# Patient Record
Sex: Female | Born: 1980 | Race: White | Hispanic: Yes | Smoking: Never smoker
Health system: Southern US, Community
[De-identification: ages and names within clinical notes are randomized; demographics above are authoritative.]

## PROBLEM LIST (undated history)

## (undated) DIAGNOSIS — E0590 Thyrotoxicosis, unspecified without thyrotoxic crisis or storm: Secondary | ICD-10-CM

## (undated) DIAGNOSIS — T7840XA Allergy, unspecified, initial encounter: Secondary | ICD-10-CM

## (undated) DIAGNOSIS — E079 Disorder of thyroid, unspecified: Secondary | ICD-10-CM

## (undated) HISTORY — DX: Allergy, unspecified, initial encounter: T78.40XA

## (undated) HISTORY — DX: Disorder of thyroid, unspecified: E07.9

---

## 2004-09-05 ENCOUNTER — Ambulatory Visit (HOSPITAL_COMMUNITY): Admission: RE | Admit: 2004-09-05 | Discharge: 2004-09-05 | Payer: Self-pay | Admitting: *Deleted

## 2004-09-26 ENCOUNTER — Ambulatory Visit (HOSPITAL_COMMUNITY): Admission: RE | Admit: 2004-09-26 | Discharge: 2004-09-26 | Payer: Self-pay | Admitting: Obstetrics & Gynecology

## 2004-10-25 ENCOUNTER — Observation Stay (HOSPITAL_COMMUNITY): Admission: AD | Admit: 2004-10-25 | Discharge: 2004-10-25 | Payer: Self-pay | Admitting: Obstetrics & Gynecology

## 2004-10-27 ENCOUNTER — Inpatient Hospital Stay (HOSPITAL_COMMUNITY): Admission: AD | Admit: 2004-10-27 | Discharge: 2004-10-30 | Payer: Self-pay | Admitting: Obstetrics & Gynecology

## 2004-10-28 ENCOUNTER — Encounter (INDEPENDENT_AMBULATORY_CARE_PROVIDER_SITE_OTHER): Payer: Self-pay | Admitting: *Deleted

## 2004-10-28 ENCOUNTER — Ambulatory Visit: Payer: Self-pay | Admitting: Obstetrics & Gynecology

## 2004-11-02 ENCOUNTER — Inpatient Hospital Stay (HOSPITAL_COMMUNITY): Admission: AD | Admit: 2004-11-02 | Discharge: 2004-11-02 | Payer: Self-pay | Admitting: *Deleted

## 2010-03-22 ENCOUNTER — Encounter (INDEPENDENT_AMBULATORY_CARE_PROVIDER_SITE_OTHER): Payer: Self-pay | Admitting: *Deleted

## 2010-03-22 LAB — CONVERTED CEMR LAB
ALT: 78 units/L — ABNORMAL HIGH (ref 0–35)
AST: 41 units/L — ABNORMAL HIGH (ref 0–37)
Albumin: 4.2 g/dL (ref 3.5–5.2)
Alkaline Phosphatase: 247 units/L — ABNORMAL HIGH (ref 39–117)
Basophils Absolute: 0 10*3/uL (ref 0.0–0.1)
Eosinophils Absolute: 0.1 10*3/uL (ref 0.0–0.7)
Eosinophils Relative: 2 % (ref 0–5)
HCT: 39.3 % (ref 36.0–46.0)
Lymphocytes Relative: 40 % (ref 12–46)
MCV: 83.1 fL (ref 78.0–100.0)
Neutrophils Relative %: 48 % (ref 43–77)
Platelets: 281 10*3/uL (ref 150–400)
Potassium: 3.9 meq/L (ref 3.5–5.3)
RDW: 13.2 % (ref 11.5–15.5)
Sodium: 140 meq/L (ref 135–145)
T3 Uptake Ratio: 46.6 % — ABNORMAL HIGH (ref 22.5–37.0)
T4, Total: 20.7 ug/dL — ABNORMAL HIGH (ref 5.0–12.5)
TSH: 0.007 microintl units/mL — ABNORMAL LOW (ref 0.350–4.500)
Total Bilirubin: 1 mg/dL (ref 0.3–1.2)
Total Protein: 6.8 g/dL (ref 6.0–8.3)
WBC: 5.7 10*3/uL (ref 4.0–10.5)

## 2010-04-24 ENCOUNTER — Ambulatory Visit (HOSPITAL_COMMUNITY)
Admission: RE | Admit: 2010-04-24 | Discharge: 2010-04-24 | Payer: Self-pay | Source: Home / Self Care | Admitting: Family Medicine

## 2010-10-19 NOTE — Discharge Summary (Signed)
NAME:  Evelyn Price NO.:  [id number]   MEDICAL RECORD NO.:  [id number]           PATIENT TYPE:   LOCATION:                                 FACILITY:   PHYSICIAN:  Lesly Dukes, M.D.      DATE OF BIRTH:   DATE OF ADMISSION:  DATE OF DISCHARGE:                                 DISCHARGE SUMMARY   Patient is a 30 year old G1, P0 who is admitted at 40 weeks and 5 days in  early labor with a cervical dilation of 2-3 cm.  Patient had prenatal care  at [facility] with late onset at 32 weeks.  She underwent Pitocin  augmentation and made slow cervical change and started pushing on the  evening of the 27th.  She pushed one hour with epidural off and had stayed  at +1.  She was consented for primary cesarean section.  [employer] [phone] patient understood that risks included, but limited to,  bleeding, infection, damage to the bowel, bladder, uterus, ovaries, and  fallopian tubes.  Patient went to the operating room and had an uneventful  cesarean section low flap transverse.  The EBL was 900 mL.  Apgars were 8  and 9.  The baby weighed 3420 g which was 7 pounds 8 ounces.  The patient  also had chorioamnionitis and IV antibiotics including ampicillin and  gentamicin prior to cesarean section.  These antibiotics were continued  postpartum for one day and then discontinued.  The patient went home breast-  feeding and on Micronor.  She used Percocet for pain.  She is to follow up  at [facility] for postpartum visit and to come to [facility] for staple  removal.       KHL/MEDQ  D:  12/06/2004  T:  12/06/2004  Job:  [id number]

## 2010-10-19 NOTE — Op Note (Signed)
NAME:  Evelyn Price NO.:  [id number]   MEDICAL RECORD NO.:  [id number]          PATIENT TYPE:  INP   LOCATION:  9145                          FACILITY:  [facility]   PHYSICIAN:  Lesly Dukes, M.D. DATE OF BIRTH:  1980-11-28   DATE OF PROCEDURE:  10/28/2004  DATE OF DISCHARGE:                                 OPERATIVE REPORT   PREOPERATIVE DIAGNOSIS:  A 30 year old, gravida 1, para 0 at term with  failure to descend.   POSTOPERATIVE DIAGNOSIS:  A 30 year old, gravida 1, para 0 at term with  failure to descend.   OPERATION/PROCEDURE:  Primary low transverse cesarean section.   SURGEON:  Lesly Dukes, M.D.   ANESTHESIA:  General.   SPECIMENS:  Placenta.   ESTIMATED BLOOD LOSS:  900 mL.   COMPLICATIONS:  None.   FINDINGS:  Viable female infant, Apgars 8 at one minute and 9 at five minutes.  Vertex ROT, asynclitic presentation with large caput. Clear fluid.  Arterial  cord pH 7.30.  Normal placenta with a three-vessel cord.  Normal uterus,  ovaries and fallopian tubes.  Mild atony that was resolved with carboprost  and Methergine.  NICU team at delivery.   DESCRIPTION OF PROCEDURE:  After fully informed consent was obtained, the  patient was taken to the operating room where general anesthesia had to be  induced secondary to the epidural not providing adequate anesthesia.  The  general anesthesia was induced after normal sterile prep was done.  A  Pfannenstiel skin incision was made with the scalpel and carried down to the  layer of fascia.  The fascia was incised in the midline and the incision was  extended bilaterally with the Mayo scissors.  Superior and inferior aspect  of the fascial incision was grasped with Kocher clamps, tented up, dissected  off sharply and bluntly to the underlying layers of rectus muscles.  The  rectus muscles were separated bluntly in the midline.  The peritoneum was  identified and entered bluntly.  The vesicouterine  peritoneum was  identified, tented up, entered sharply with Metzenbaum scissors.  This  incision was extended bilaterally.  Bladder flap was created digitally.  The  uterine incision was made in transverse fashion with a scalpel.  This  incision was extended bilaterally bluntly.  Baby's head delivered easily.  Nose and mouth were suctioned.  The rest of the baby's body was delivered  without complications.  Cord was clamped and cut and the baby was handed to  the waiting pediatrician.  Cord blood was sent for type and screen.  Cord  gas was also sent with the above findings.  Placenta delivered spontaneously  and appeared grossly normal with a three-vessel cord.  The uterus was  exteriorized and cleared of clots and debris.  The uterus was closed with 0  Vicryl in a running locked fashion.  Hemostasis was noted.  0 Vicryl was  used to help reinforce the incision.  The uterus was returned to the abdomen  and noted to be hemostatic on tension.  The gutters were cleared of all  clots  and debris.  Hemostasis was assured __________ time on the uterus.  The peritoneum and rectus muscle were noted to be hemostatic.  Fascia was  closed with 0 Vicryl in a running fashion and good hemostasis was noted on  this layer as well.  Subcutaneous tissue  was copiously irrigated and found to be hemostatic.  Skin was closed with  staples.  The patient tolerated the procedure well.  Sponge, lap,  instrument, and needle counts were correct x2.  The patient tolerated the  procedure well.      KHL/MEDQ  D:  10/28/2004  T:  10/28/2004  Job:  [id number]

## 2011-01-25 ENCOUNTER — Other Ambulatory Visit: Payer: Self-pay | Admitting: Internal Medicine

## 2011-01-30 ENCOUNTER — Ambulatory Visit
Admission: RE | Admit: 2011-01-30 | Discharge: 2011-01-30 | Disposition: A | Discharge status: Home / Self Care | Payer: Self-pay | Source: Ambulatory Visit | Attending: Internal Medicine | Admitting: Internal Medicine

## 2012-03-15 ENCOUNTER — Ambulatory Visit: Payer: Self-pay | Admitting: Family Medicine

## 2012-03-15 VITALS — BP 112/72 | HR 68 | Temp 98.2°F | Resp 16 | Ht 66.25 in | Wt 119.6 lb

## 2012-03-15 DIAGNOSIS — N926 Irregular menstruation, unspecified: Secondary | ICD-10-CM

## 2012-03-15 DIAGNOSIS — Z309 Encounter for contraceptive management, unspecified: Secondary | ICD-10-CM

## 2012-03-15 DIAGNOSIS — N898 Other specified noninflammatory disorders of vagina: Secondary | ICD-10-CM

## 2012-03-15 DIAGNOSIS — N939 Abnormal uterine and vaginal bleeding, unspecified: Secondary | ICD-10-CM

## 2012-03-15 MED ORDER — NORGESTIMATE-ETH ESTRADIOL 0.25-35 MG-MCG PO TABS: 1.0000 | ORAL_TABLET | Freq: Every day | ORAL | Status: DC

## 2012-03-15 NOTE — Progress Notes (Signed)
[address]   [place], [place]  [address]   [phone]  Subjective:    Patient ID: Evelyn Price, female    DOB: 02-16-1981, 31 y.o.   MRN: [id number]  HPIThis 31 y.o. female presents for evaluation of vaginal bleeding irregular.  +low back pain.  LMP 02/29/12 normal menses.  Regular menses usually but now two weeks early this time.  Took morning after pill on Thursday/3 days ago.  Last sexual encounter was Wednesday.  Has been sexually active; no condoms.  Mild vaginal discharge clear stringy without odor.  Mild vaginal itching for several weeks.  Bought OTC medications; helped.  No history of STDs.  Current partner  X 1 year.  Total partners=2.  Mild low back pain.  Bleeding slightly lighter than normal menses.  Previous use of OCP.  No tobacco abuse.  No history of blood clots.  S/p follow-up with PCP one month ago; thyroid levels stable on current dose of medication.  PCP:  Marcy Panning PMH:  Hyperthyroidism Psurg: C-section x 1 All; NKDA Social: single; dating; lives with son; employment cleaning; no tobacco/ETOH/drugs.   Family :  M-living at age 46; healthy.  F-healthy; living age 93.  Siblings: healthy.   Review of Systems  Constitutional: Negative for fever, chills, diaphoresis and fatigue.  Genitourinary: Positive for vaginal bleeding, vaginal discharge, menstrual problem and pelvic pain. Negative for dysuria, urgency, frequency, hematuria, flank pain, enuresis, genital sores, vaginal pain and dyspareunia.       Objective:   Physical Exam  Nursing note and vitals reviewed. Constitutional: She is oriented to person, place, and time. She appears well-developed and well-nourished.  HENT:  Head: Normocephalic and atraumatic.  Eyes: Conjunctivae normal are normal. Pupils are equal, round, and reactive to light.  Neck: Normal range of motion. Neck supple. No thyromegaly present.  Cardiovascular: Normal rate, regular rhythm and normal heart sounds.   No murmur  heard. Pulmonary/Chest: Effort normal and breath sounds normal.  Abdominal: Soft. Bowel sounds are normal. She exhibits no distension and no mass. There is no tenderness. There is no rebound and no guarding.  Genitourinary: Uterus normal. There is no rash, tenderness, lesion or injury on the right labia. There is no rash, tenderness, lesion or injury on the left labia. There is bleeding around the vagina. No vaginal discharge found.       MODERATE AMOUNT OF BLOOD IN VAGINAL VAULT; ACTIVE MILD BLEEDING FROM CERVICAL OS.  Neurological: She is alert and oriented to person, place, and time.  Psychiatric: She has a normal mood and affect. Her behavior is normal. Judgment and thought content normal.   Results for orders placed in visit on 03/15/12  POCT URINE PREGNANCY      Component Value Range   Preg Test, Ur Negative         Assessment & Plan:   1. Abnormal menses  POCT urine pregnancy, GC/chlamydia probe amp, genital  2. Vaginal bleeding  GC/chlamydia probe amp, genital  3. Contraception management  norgestimate-ethinyl estradiol (SPRINTEC 28) 0.25-35 MG-MCG tablet     1.  Irregular vaginal bleeding: New.  Onset 72 hours after taking Morning After Pill; urine pregnancy negative. Advised pt that irregular vaginal bleeding common side effect of Morning After Pill or any OCP.  Send GC/Chlam due to recent vaginal discharge.   Recommend repeat urine pregnancy test OTC in one month. 2.  Contraception Management: New.  Desires contraception.  Non-smoker; no family history of clotting; rx for Sprintec to obtain  at [facility] for $9/month.  Follow-up with PCP.

## 2012-03-15 NOTE — Patient Instructions (Addendum)
1. Abnormal menses  POCT urine pregnancy, GC/chlamydia probe amp, genital  2. Vaginal bleeding  GC/chlamydia probe amp, genital  3. Contraception management  norgestimate-ethinyl estradiol (SPRINTEC 28) 0.25-35 MG-MCG tablet

## 2012-03-16 LAB — GC/CHLAMYDIA PROBE AMP, GENITAL: Chlamydia, DNA Probe: NEGATIVE

## 2012-03-18 ENCOUNTER — Encounter: Payer: Self-pay | Admitting: Family Medicine

## 2012-09-19 ENCOUNTER — Other Ambulatory Visit: Payer: Self-pay | Admitting: Physician Assistant

## 2012-09-19 ENCOUNTER — Telehealth: Payer: Self-pay | Admitting: *Deleted

## 2012-09-19 ENCOUNTER — Ambulatory Visit: Payer: Self-pay | Admitting: Physician Assistant

## 2012-09-19 VITALS — BP 121/71 | HR 70 | Temp 98.2°F | Resp 16 | Ht 60.0 in | Wt 125.0 lb

## 2012-09-19 DIAGNOSIS — E0590 Thyrotoxicosis, unspecified without thyrotoxic crisis or storm: Secondary | ICD-10-CM | POA: Insufficient documentation

## 2012-09-19 DIAGNOSIS — Z124 Encounter for screening for malignant neoplasm of cervix: Secondary | ICD-10-CM

## 2012-09-19 DIAGNOSIS — N898 Other specified noninflammatory disorders of vagina: Secondary | ICD-10-CM

## 2012-09-19 DIAGNOSIS — J309 Allergic rhinitis, unspecified: Secondary | ICD-10-CM | POA: Insufficient documentation

## 2012-09-19 DIAGNOSIS — R81 Glycosuria: Secondary | ICD-10-CM

## 2012-09-19 LAB — POCT URINALYSIS DIPSTICK
Bilirubin, UA: NEGATIVE
Glucose, UA: 100
Leukocytes, UA: NEGATIVE
Nitrite, UA: NEGATIVE

## 2012-09-19 LAB — POCT WET PREP WITH KOH: Clue Cells Wet Prep HPF POC: NEGATIVE

## 2012-09-19 LAB — POCT CBC
Granulocyte percent: 60.4 %G (ref 37–80)
HCT, POC: 42.2 % (ref 37.7–47.9)
POC Granulocyte: 5.4 (ref 2–6.9)
POC LYMPH PERCENT: 32.1 %L (ref 10–50)
Platelet Count, POC: 283 10*3/uL (ref 142–424)
RDW, POC: 13.7 %

## 2012-09-19 LAB — POCT UA - MICROSCOPIC ONLY
WBC, Ur, HPF, POC: NEGATIVE
Yeast, UA: NEGATIVE

## 2012-09-19 LAB — GLUCOSE, POCT (MANUAL RESULT ENTRY): POC Glucose: 94 mg/dl (ref 70–99)

## 2012-09-19 MED ORDER — OLOPATADINE HCL 0.2 % OP SOLN: 1.0000 [drp] | Freq: Two times a day (BID) | OPHTHALMIC | Status: DC | PRN

## 2012-09-19 MED ORDER — FLUTICASONE PROPIONATE 50 MCG/ACT NA SUSP: 2.0000 | Freq: Every day | NASAL | Status: DC

## 2012-09-19 NOTE — Telephone Encounter (Signed)
error 

## 2012-09-19 NOTE — Patient Instructions (Addendum)
For now, continue the oral antihistamine.  When the eye drops and nasal spray have helped your symptoms, stop the oral antihistamine. I will contact you with your lab results as soon as they are available.   If you have not heard from me in 2 weeks, please contact me.  The fastest way to get your results is to register for My Chart (see the instructions on the last page of this printout).

## 2012-09-19 NOTE — Progress Notes (Signed)
Subjective:    Patient ID: Evelyn Price, female    DOB: [birth year], 32 y.o.   MRN: [id number]  HPI This 32 y.o. female presents for evaluation of allergies and "physical."  Itchy eyes.  "I know I have allergy to the pollen.  The claritin and all from the pharmacies don't work, so I need something from the doctor." Watery eyes, some whitish drainange from her eyes.  Nasal congestion.  "I need a physical, because I think I have an infection." Some intermittent discharge, itching, dyspareunia, but inconsistent.  History of vaginal yeast infection with these symptoms. Last pap test about 2 years ago, desires a pap today. Nothing to prevent STI or pregnancy. She thinks her boyfriend has other partners, though he denies it.   Review of Systems  Constitutional: Negative.   Eyes: Positive for discharge (clear), redness and itching. Negative for visual disturbance.  Respiratory: Negative for shortness of breath.   Cardiovascular: Negative for chest pain.  Gastrointestinal: Negative for nausea, vomiting and diarrhea.  Endocrine: Negative.   Genitourinary: Positive for vaginal discharge, vaginal pain and dyspareunia. Negative for dysuria, urgency, hematuria, menstrual problem and pelvic pain.  Musculoskeletal: Negative for myalgias and arthralgias.  Allergic/Immunologic: Positive for environmental allergies. Negative for food allergies and immunocompromised state.  Neurological: Positive for headaches. Negative for dizziness.  Hematological: Negative.   Psychiatric/Behavioral: Negative for sleep disturbance.       Objective:   Physical Exam  Vitals reviewed. Constitutional: She is oriented to person, place, and time. Vital signs are normal. She appears well-developed and well-nourished. She is active and cooperative. No distress.  HENT:  Head: Normocephalic and atraumatic.  Right Ear: Hearing, tympanic membrane, external ear and ear canal normal. No foreign bodies.  Left Ear:  Hearing, tympanic membrane, external ear and ear canal normal. No foreign bodies.  Nose: Nose normal.  Mouth/Throat: Uvula is midline, oropharynx is clear and moist and mucous membranes are normal. No oral lesions. Normal dentition. No dental abscesses or edematous. No oropharyngeal exudate.  Eyes: Conjunctivae, EOM and lids are normal. Pupils are equal, round, and reactive to light. Right eye exhibits no discharge. Left eye exhibits no discharge. No scleral icterus.  Fundoscopic exam:      The right eye shows no arteriolar narrowing, no AV nicking, no exudate, no hemorrhage and no papilledema.       The left eye shows no arteriolar narrowing, no AV nicking, no exudate, no hemorrhage and no papilledema.  Neck: Trachea normal, normal range of motion and full passive range of motion without pain. Neck supple. No spinous process tenderness and no muscular tenderness present. No mass and no thyromegaly present.  Cardiovascular: Normal rate, regular rhythm, normal heart sounds, intact distal pulses and normal pulses.   Pulmonary/Chest: Effort normal and breath sounds normal. She exhibits no tenderness and no retraction. Right breast exhibits no inverted nipple, no mass, no nipple discharge, no skin change and no tenderness. Left breast exhibits no inverted nipple, no mass, no nipple discharge, no skin change and no tenderness. Breasts are symmetrical.  Abdominal: Soft. Normal appearance and bowel sounds are normal. She exhibits no distension and no mass. There is no hepatosplenomegaly. There is no tenderness. There is no rigidity, no rebound, no guarding, no CVA tenderness, no tenderness at McBurney's point and negative Murphy's sign. No hernia. Hernia confirmed negative in the right inguinal area and confirmed negative in the left inguinal area.  Genitourinary: Rectum normal, vagina normal and uterus normal. Rectal exam shows no external  hemorrhoid and no fissure. No breast swelling, tenderness, discharge or  bleeding. Pelvic exam was performed with patient supine. No labial fusion. There is no rash, tenderness, lesion or injury on the right labia. There is no rash, tenderness, lesion or injury on the left labia. Cervix exhibits no motion tenderness, no discharge and no friability. Right adnexum displays no mass, no tenderness and no fullness. Left adnexum displays no mass, no tenderness and no fullness. No erythema, tenderness or bleeding around the vagina. No foreign body around the vagina. No signs of injury around the vagina. No vaginal discharge found.  Musculoskeletal: She exhibits no edema and no tenderness.       Cervical back: Normal.       Thoracic back: Normal.       Lumbar back: Normal.  Lymphadenopathy:       Head (right side): No tonsillar, no preauricular, no posterior auricular and no occipital adenopathy present.       Head (left side): No tonsillar, no preauricular, no posterior auricular and no occipital adenopathy present.    She has no cervical adenopathy.    She has no axillary adenopathy.       Right: No inguinal and no supraclavicular adenopathy present.       Left: No inguinal and no supraclavicular adenopathy present.  Neurological: She is alert and oriented to person, place, and time. She has normal strength and normal reflexes. No cranial nerve deficit. She exhibits normal muscle tone. Coordination and gait normal.  Skin: Skin is warm, dry and intact. No rash noted. She is not diaphoretic. No cyanosis or erythema. Nails show no clubbing.  Psychiatric: She has a normal mood and affect. Her speech is normal and behavior is normal. Judgment and thought content normal.    Results for orders placed in visit on 09/19/12  POCT CBC      Result Value Range   WBC 8.9  4.6 - 10.2 K/uL   Lymph, poc 2.9  0.6 - 3.4   POC LYMPH PERCENT 32.1  10 - 50 %L   MID (cbc) 0.7  0 - 0.9   POC MID % 7.5  0 - 12 %M   POC Granulocyte 5.4  2 - 6.9   Granulocyte percent 60.4  37 - 80 %G   RBC  4.50  4.04 - 5.48 M/uL   Hemoglobin 13.2  12.2 - 16.2 g/dL   HCT, POC 40.9  81.1 - 47.9 %   MCV 93.7  80 - 97 fL   MCH, POC 29.3  27 - 31.2 pg   MCHC 31.3 (*) 31.8 - 35.4 g/dL   RDW, POC 91.4     Platelet Count, POC 283  142 - 424 K/uL   MPV 8.6  0 - 99.8 fL  GLUCOSE, POCT (MANUAL RESULT ENTRY)      Result Value Range   POC Glucose 94  70 - 99 mg/dl  POCT UA - MICROSCOPIC ONLY      Result Value Range   WBC, Ur, HPF, POC neg     RBC, urine, microscopic 0-1     Bacteria, U Microscopic neg     Mucus, UA neg     Epithelial cells, urine per micros neg     Crystals, Ur, HPF, POC neg     Casts, Ur, LPF, POC neg     Yeast, UA neg    POCT URINALYSIS DIPSTICK      Result Value Range   Color, UA light yellow  Clarity, UA clear     Glucose, UA 100     Bilirubin, UA neg     Ketones, UA neg     Spec Grav, UA 1.015     Blood, UA small     pH, UA 7.0     Protein, UA neg     Urobilinogen, UA 0.2     Nitrite, UA neg     Leukocytes, UA Negative    POCT WET PREP WITH KOH      Result Value Range   Trichomonas, UA Negative     Clue Cells Wet Prep HPF POC neg     Epithelial Wet Prep HPF POC 7-16     Yeast Wet Prep HPF POC neg     Bacteria Wet Prep HPF POC 2+     RBC Wet Prep HPF POC neg     WBC Wet Prep HPF POC 0-2     KOH Prep POC Negative    POCT GLYCOSYLATED HEMOGLOBIN (HGB A1C)      Result Value Range   Hemoglobin A1C 5.6         Assessment & Plan:  Allergic rhinitis - Plan: fluticasone (FLONASE) 50 MCG/ACT nasal spray, Olopatadine HCl 0.2 % SOLN; continue OTC oral antihistamine for now.  Screening for cervical cancer - Plan: Pap IG, CT/NG w/ reflex HPV when ASC-U  Vaginal discharge - Plan: POCT CBC, POCT glucose (manual entry), POCT UA - Microscopic Only, POCT urinalysis dipstick, POCT Wet Prep with KOH; encourage condom use, adequate hydration.  Glucosuria - Plan: POCT glycosylated hemoglobin (Hb A1C). No other evidence of diabetes.  Healthy eating, exercise, hydration.   Repeat at next visit.  Fernande Bras, PA-C Physician Assistant-Certified [facility]

## 2012-09-21 ENCOUNTER — Encounter: Payer: Self-pay | Admitting: Physician Assistant

## 2012-09-21 LAB — PAP IG, CT-NG, RFX HPV ASCU: Chlamydia Probe Amp: NEGATIVE

## 2012-09-21 NOTE — Progress Notes (Signed)
This encounter was created in error - please disregard.

## 2012-09-23 ENCOUNTER — Encounter: Payer: Self-pay | Admitting: Physician Assistant

## 2013-06-17 ENCOUNTER — Ambulatory Visit: Payer: Self-pay | Admitting: Emergency Medicine

## 2013-06-17 VITALS — BP 110/64 | HR 100 | Temp 101.8°F | Resp 18 | Ht 60.5 in | Wt 120.0 lb

## 2013-06-17 DIAGNOSIS — J36 Peritonsillar abscess: Secondary | ICD-10-CM

## 2013-06-17 DIAGNOSIS — J020 Streptococcal pharyngitis: Secondary | ICD-10-CM

## 2013-06-17 DIAGNOSIS — J029 Acute pharyngitis, unspecified: Secondary | ICD-10-CM

## 2013-06-17 LAB — POCT RAPID STREP A (OFFICE): RAPID STREP A SCREEN: POSITIVE — AB

## 2013-06-17 NOTE — Progress Notes (Addendum)
   Subjective:  This chart was scribed for Viviann SpareSteven A. Cleta Albertsaub, MD by Ashley JacobsBrittany Andrews, [facility] Scribe. The patient was seen in room and the patient's care was started at 8:58 AM.   Patient ID: Evelyn Price, female    DOB: Jul 16, 1980, 33 y.o.   MRN: [id number]  HPI HPI Comments: Evelyn Price is a 33 y.o. female who arrives to the [facility] complaining of sore throat, fever, and emesis for the past six days. She states having peritonsillar abscess infection last year. She is experiencing constant right sided pain and edema to her tonsil. Pt has the associated symptoms of voice changes, drooling, chills and cough. Nothing seems to resolve her symptoms. She has a past medical hx of allergic rhinitis and hyperthyroidism. She does not have any known allergies to medications.     Review of Systems  Constitutional: Positive for fever and chills.  HENT: Positive for drooling and voice change.   Respiratory: Positive for cough.   Gastrointestinal: Positive for nausea and vomiting.  All other systems reviewed and are negative.   BP 110/64  Pulse 100  Temp(Src) 101.8 F (38.8 C) (Oral)  Resp 18  Ht 5' 0.5" (1.537 m)  Wt 120 lb (54.432 kg)  BMI 23.04 kg/m2  SpO2 98%  LMP 05/30/2013     Objective:   Physical Exam  Nursing note and vitals reviewed. Constitutional: She is oriented to person, place, and time. She appears well-developed and well-nourished. No distress.  HENT:  Head: Normocephalic and atraumatic.  Right Ear: Hearing, tympanic membrane, external ear and ear canal normal.  Left Ear: Hearing, tympanic membrane, external ear and ear canal normal.  Mouth/Throat: Uvula swelling present. Tonsillar abscesses present.  Swollen uvula displacement of uvula to the left  Eyes: EOM are normal. Pupils are equal, round, and reactive to light.  Neck: Normal range of motion. Neck supple. No tracheal deviation present.    Cardiovascular: Normal rate.   Pulmonary/Chest: Effort normal. No respiratory distress.  Abdominal: Soft. She exhibits no distension.  Musculoskeletal: Normal range of motion.  Neurological: She is alert and oriented to person, place, and time.  Skin: Skin is warm and dry.  Psychiatric: She has a normal mood and affect. Her behavior is normal.   Results for orders placed in visit on 06/17/13  POCT RAPID STREP A (OFFICE)      Result Value Range   Rapid Strep A Screen Positive (*) Negative      DIAGNOSTIC STUDIES: Oxygen Saturation is 98% on room air, normal by my interpretation.    COORDINATION OF CARE:  9:04 AM Discussed course of care with pt which includes referral to ENT. Pt understands and agrees.       Assessment & Plan:   The patient has a right peritonsillar abscess she is going to ENT for their evaluation she does understand . Also tested positive for strep.

## 2013-06-17 NOTE — Patient Instructions (Addendum)
Celulitis periamigdalina  (Peritonsillar Cellulitis) La celulitis periamigdalina es una infeccin alrededor de Designer, jewelleryla amgdala. Esta infeccin suele afectar a Norfolk Southernuna de las dos amgdalas. El resultado es un fuerte dolor de Advertising copywritergarganta. La celulitis periamigdalina puede aparecer a cualquier edad. Generalmente se desarrolla en personas que han sufrido dolores de garganta frecuentes y que han tomado antibiticos con frecuencia.  CAUSAS  La celulitis periamigdalina suele tener su origen en ms de un tipo de germen (bacterias).  SNTOMAS  En un comienzo, puede parecer como un dolor de garganta habitual. Pero en este caso el dolor no desaparece luego de Hartford Financialpocos das. En cambio, empeora.   Los primeros sntomas de la celulitis periamigdalina pueden ser:  Grant RutsFiebre o escalofros.  Dolor de Metallurgistgarganta en un solo lado.  Dolor en un odo.  Dolor al tragar.  Sentirse ms cansado que de Dellrosecostumbre.  Entre los sntomas posteriores se incluyen:  Dolor intenso al tragar.  Babeo.  Dificultad para abrir Government social research officerla boca.  Mal aliento.  Cambios en la voz. DIAGNSTICO  En la mayora de los North Crows Nestcasos, Oregonel mdico har el diagnstico al The Northwestern Mutualconocer los sntomas, haciendo un examen de la garganta y tomando un cultivo de las secreciones de Administratorla garganta. Los ARAMARK Corporationanlisis de sangre tambin pueden ayudar a Production assistant, radiodeterminar la causa de su dolor de Advertising copywritergarganta.  TRATAMIENTO  No se trata de un dolor de garganta comn. Es una enfermedad que debe tratarse rpidamente. Si no se trata, puede haber hinchazn y pus (absceso).   Esta afeccin generalmente se trata con antibiticos. Estas infecciones requieren antibiticos por va oral durante un total de 10 das o antibiticos administrados por vena (intravenoso, IV).  Podrn recetarle medicamentos para calmar el dolor o la fiebre.  En algunos casos se recetan medicamentos para disminuir la inflamacin (corticoides).  Si se ha formado un absceso, puede ser necesario drenarlo.  Las personas que han repetido  los casos de celulitis periamigdalina pueden necesitar una operacin para extirpar las amgdalas (amigdalectoma). INSTRUCCIONES PARA EL CUIDADO EN EL HOGAR   Tome todos los medicamentos segn le indic su mdico. Finalice todos los antibiticos, aunque comience a sentirse mejor.  Es normal sentir un poco de Copywriter, advertisingdolor en esta afeccin. Tome los analgsicos segn las indicaciones del mdico. No tome otros medicamentos para el dolor excepto que lo autorice el mdico.  Hgase grgaras con agua tibia con sal. Mezcle 1 cucharadita (5 gramos) de sal en 1 taza (250 ml) de agua tibia. Debe hacerse las grgaras durante 30 segundos o ms antes de OfficeMax Incorporatedescupir la solucin. Hgalas de 3 a 4 veces al da o segn sea necesario. Esto le Engineer, materialsaliviar el dolor y la hinchazn.  Ser necesario que siga una dieta lquida o blanda si no puede tragar.  Es importante beber lquidos. Beba gran cantidad de lquido para mantener la orina de tono claro o color amarillo plido.  No fume.  Haga reposo y Wintonduerma bien.  Si el mdico le ha dado fecha para una visita de control, es importante que concurra. El mdico necesitar asegurarse de que la infeccin [place] Springsmejora. Es importante controlar que no se forme un absceso.  Regrese a su trabajo o a la escuela cuando el profesional se lo indique. SOLICITE ATENCIN MDICA SI:   La hinchazn aumenta.  Tiene dificultad para tragar.  No puede tomar los antibiticos. SOLICITE ATENCIN MDICA DE INMEDIATO SI:   Tiene dificultad para respirar.  El dolor Siletzempeora, a pesar de haber tomado analgsicos.  Tiene pus alrededor o cerca de las Goldfieldamgdalas.  Willow OraLe cambia  la voz.  Babea.  Tose y escupe esputo sanguinolento.  No puede tragar.  Tiene fiebre. ASEGRESE DE QUE:   Comprende estas instrucciones.  Controlar su enfermedad.  Solicitar ayuda de inmediato si no mejora o si empeora. Document Released: 05/06/2012 [facility] Patient Information 2014 [place], [place].  Angina por  estreptococos (Strep Throat)  La angina estreptocccica es una infeccin en la garganta causada por una bacteria llamada Streptococcus pyogenes. El mdico la llamar "amigdalitis" o "faringitis" estreptocccica, segn haya signos de inflamacin en las amgdalas o en la zona posterior de la garganta. Es ms frecuente en nios entre los 5 y los 15 aos durante los meses de fro, Biomedical engineer puede ocurrir a Dealer de Actuary. La infeccin se transmite de persona a persona (contagio) a travs de la tos, el estornudo u otro contacto cercano.  SNTOMAS  Fiebre o escalofros.  La garganta o las [employer] duelen y estn inflamadas.  Dolor o dificultad para tragar.  Puntos blancos o amarillos en las amgdalas o la garganta.  Ganglios linfticos hinchados a los lados del cuello o debajo de la [place].  Erupcin rojiza en todo el cuerpo (poco comn). DIAGNSTICO Diferentes infecciones pueden causar los mismos sntomas. Para confirmar el diagnstico deber [employer], y as podrn prescribirle el tratamiento adecuado. La "prueba rpida de estreptococo" ayudar al mdico a hacer el diagnstico en algunos minutos. Si no se dispone de la prueba, se har un rpido hisopado de la zona afectada para hacer un cultivo de las secreciones de la garganta. Si se hace un cultivo, los resultados estarn disponibles en [employer].  TRATAMIENTO El estreptococo de garganta se trata con antibiticos. INSTRUCCIONES PARA EL CUIDADO DOMICILIARIO  Haga grgaras con 1 cucharadita de sal en 1 taza de agua tibia, 3  4 veces por da o cuando lo crea necesario para sentirse mejor.  Los miembros de la familia que tambin tengan dolor en la garganta deben ser evaluados y tratados con antibiticos si tienen la infeccin.  Asegrese de que todas las personas de su casa se laven [employer].  No comparta alimentos, tazas ni utensilios personales que puedan contagiar la infeccin.  Coma alimentos blandos hasta  que el dolor de garganta mejore.  Beba gran cantidad de lquido para mantener la orina de tono claro o color amarillo plido. Esto ayudar a Agricultural engineer.  Debe hacer reposo.  No concurra a la escuela o la trabajo hasta que haya tomado los antibiticos durante 24 horas.  Utilice los medicamentos de venta libre o de prescripcin para Chief Technology Officer, Environmental health practitioner o la [place], segn se lo indique el profesional que lo asiste.  Si le han recetado medicamentos, tmelos segn las indicaciones. Finalice la prescripcin completa, aunque se sienta mejor. SOLICITE ATENCIN MDICA SI:  Los ganglios del cuello siguen agrandados.  Aparece una erupcin cutnea, tos o dolor de odos.  Tiene un catarro verde, amarillo amarronado o esputo sanguinolento.  Siente dolor o Dentist que no puede controlar con los medicamentos.  Los sntomas parecen empeorar en vez de mejorar. SOLICITE ATENCIN MDICA DE INMEDIATO SI:  Presenta algn nuevo sntoma, como vmitos, dolor de cabeza intenso, rigidez o dolor en el cuello, dolor en el pecho o problemas respiratorios o dificultad para tragar.  Presenta dolor de garganta intenso, babeo o cambios en la voz.  Siente que el cuello se hincha o la piel de esa zona se vuelve roja y sensible.  Tiene fiebre.  Tiene signos de deshidratacin, como fatiga, boca  seca y disminucin de la [place].  Comienza a sentir mucho sueo, o no puede despertarse bien. Document Released: 02/27/2005 Document Revised: 05/06/2012 [facility] Patient Information 2014 [place], [place].

## 2013-07-06 ENCOUNTER — Encounter: Payer: Self-pay | Admitting: Family Medicine

## 2013-07-06 DIAGNOSIS — J36 Peritonsillar abscess: Secondary | ICD-10-CM | POA: Insufficient documentation

## 2015-10-17 ENCOUNTER — Ambulatory Visit (INDEPENDENT_AMBULATORY_CARE_PROVIDER_SITE_OTHER): Payer: Self-pay | Admitting: Gynecology

## 2015-10-17 ENCOUNTER — Encounter: Payer: Self-pay | Admitting: Gynecology

## 2015-10-17 VITALS — BP 122/80 | Ht 60.0 in | Wt 133.0 lb

## 2015-10-17 DIAGNOSIS — N888 Other specified noninflammatory disorders of cervix uteri: Secondary | ICD-10-CM

## 2015-10-17 DIAGNOSIS — N941 Unspecified dyspareunia: Secondary | ICD-10-CM

## 2015-10-17 NOTE — Progress Notes (Signed)
   HPI: Patient is a 35 year old gravida 1 para 1 who was referred to our practice from her primary care physician as a result of an incidental finding on patient's cervix at time of her annual exam. We'll able to retrieve their office notes and she had a Pap smear within the past 6 months which was normal. Patient was complaining of some dyspareunia time. She reports normal menstrual cycles and her husband is using condoms for contraception. Her PCP is treating her for hypothyroidism.   ROS: A ROS was performed and pertinent positives and negatives are included in the history.  GENERAL: No fevers or chills. HEENT: No change in vision, no earache, sore throat or sinus congestion. NECK: No pain or stiffness. CARDIOVASCULAR: No chest pain or pressure. No palpitations. PULMONARY: No shortness of breath, cough or wheeze. GASTROINTESTINAL: No abdominal pain, nausea, vomiting or diarrhea, melena or bright red blood per rectum. GENITOURINARY: No urinary frequency, urgency, hesitancy or dysuria. MUSCULOSKELETAL: No joint or muscle pain, no back pain, no recent trauma. DERMATOLOGIC: No rash, no itching, no lesions. ENDOCRINE: No polyuria, polydipsia, no heat or cold intolerance. No recent change in weight. HEMATOLOGICAL: No anemia or easy bruising or bleeding. NEUROLOGIC: No headache, seizures, numbness, tingling or weakness. PSYCHIATRIC: No depression, no loss of interest in normal activity or change in sleep pattern.   PE: Gen. appearance well-developed 1 no erythema no acute distress Abdomen: Soft nontender no rebound or guarding Pelvic: Bartholin urethra Skene glands within normal limits Vagina: No lesions or discharge Cervix: No gross lesions on inspection a colposcope was utilized to inspect the vagina the fornix and cervix and only 2 small nabothian cysts were seen otherwise normal Uterus: Slightly retroverted normal size shape and consistency nontender Adnexa: No palpable mass or tenderness Rectal exam  not done   Assessment Plan: #1 and nabothian cysts patient was reassured normal Pap smear within the past 3-4 months #2 dyspareunia pelvic exam normal we'll recommend using lubricant during time of intercourse    Greater than 50% of time was spent in counseling and coordinating care of this patient.   Time of consultation: 15   Minutes.

## 2016-10-15 DIAGNOSIS — R768 Other specified abnormal immunological findings in serum: Secondary | ICD-10-CM | POA: Insufficient documentation

## 2016-10-15 DIAGNOSIS — E785 Hyperlipidemia, unspecified: Secondary | ICD-10-CM | POA: Insufficient documentation

## 2016-10-16 ENCOUNTER — Encounter: Payer: Self-pay | Admitting: Gynecology

## 2016-10-24 DIAGNOSIS — R768 Other specified abnormal immunological findings in serum: Secondary | ICD-10-CM | POA: Insufficient documentation

## 2017-03-17 LAB — OB RESULTS CONSOLE HGB/HCT, BLOOD
HEMATOCRIT: 39
HEMOGLOBIN: 12.7

## 2017-03-17 LAB — OB RESULTS CONSOLE VARICELLA ZOSTER ANTIBODY, IGG: Varicella: IMMUNE

## 2017-03-17 LAB — OB RESULTS CONSOLE RPR: RPR: NONREACTIVE

## 2017-03-17 LAB — OB RESULTS CONSOLE ABO/RH: RH TYPE: POSITIVE

## 2017-03-17 LAB — OB RESULTS CONSOLE ANTIBODY SCREEN: Antibody Screen: NEGATIVE

## 2017-03-17 LAB — OB RESULTS CONSOLE RUBELLA ANTIBODY, IGM: Rubella: IMMUNE

## 2017-03-17 LAB — OB RESULTS CONSOLE GC/CHLAMYDIA
CHLAMYDIA, DNA PROBE: NEGATIVE
GC PROBE AMP, GENITAL: NEGATIVE

## 2017-03-17 LAB — OB RESULTS CONSOLE HEPATITIS B SURFACE ANTIGEN: HEP B S AG: NEGATIVE

## 2017-03-17 LAB — OB RESULTS CONSOLE PLATELET COUNT: Platelets: 259

## 2017-03-24 ENCOUNTER — Encounter: Payer: Self-pay | Admitting: *Deleted

## 2017-03-24 ENCOUNTER — Encounter (HOSPITAL_COMMUNITY): Payer: Self-pay | Admitting: *Deleted

## 2017-03-25 ENCOUNTER — Encounter (HOSPITAL_COMMUNITY): Payer: Self-pay

## 2017-03-25 ENCOUNTER — Other Ambulatory Visit (HOSPITAL_COMMUNITY): Payer: Self-pay | Admitting: Nurse Practitioner

## 2017-03-25 ENCOUNTER — Ambulatory Visit (HOSPITAL_COMMUNITY)
Admission: RE | Admit: 2017-03-25 | Discharge: 2017-03-25 | Disposition: A | Discharge status: Home / Self Care | Payer: Self-pay | Source: Ambulatory Visit | Attending: Nurse Practitioner | Admitting: Nurse Practitioner

## 2017-03-25 VITALS — BP 125/74 | HR 70 | Wt 125.0 lb

## 2017-03-25 DIAGNOSIS — Z3682 Encounter for antenatal screening for nuchal translucency: Secondary | ICD-10-CM

## 2017-03-25 DIAGNOSIS — Z3A13 13 weeks gestation of pregnancy: Secondary | ICD-10-CM

## 2017-03-25 DIAGNOSIS — O09521 Supervision of elderly multigravida, first trimester: Secondary | ICD-10-CM

## 2017-03-25 HISTORY — DX: Thyrotoxicosis, unspecified without thyrotoxic crisis or storm: E05.90

## 2017-04-03 ENCOUNTER — Telehealth (HOSPITAL_COMMUNITY): Payer: Self-pay | Admitting: MS"

## 2017-04-03 ENCOUNTER — Other Ambulatory Visit (HOSPITAL_COMMUNITY): Payer: Self-pay

## 2017-04-03 NOTE — Telephone Encounter (Signed)
Called Evelyn Price to discuss her prenatal cell free DNA test results via Spanish/English telephonic interpreter [phone] [facility](Rafael) from [employer].  Ms. Evelyn Price had [facility].  Testing was offered because of advanced maternal age.   The patient was identified by name and DOB.  We reviewed that these are within normal limits, showing a less than 1 in 10,000 risk for trisomies 21, 18 and 13, and monosomy X (Turner syndrome).  In addition, the risk for triploidy and sex chromosome trisomies (47,XXX and 47,XXY) was also low risk.  This testing identifies > 99% of pregnancies with trisomy 5121, trisomy 5013, sex chromosome trisomies (47,XXX and 47,XXY), and triploidy. The detection rate for trisomy 18 is 96%.  The detection rate for monosomy X is ~92%.  The false positive rate is <0.1% for all conditions. Fetal sex was not reported. She understands that this testing does not identify all genetic conditions.  All questions were answered to her satisfaction, she was encouraged to call with additional questions or concerns.  Quinn PlowmanKaren Anona Giovannini, MS Patent attorneyCertified Genetic Counselor

## 2017-04-03 NOTE — Telephone Encounter (Signed)
Attempted to contact patient regarding results of noninvasive prenatal screening (NIPS)/Panorama, which are within normal limits. Message left via [facility] telephonic Spanish/English interpreter [phone] Daphine Deutscher(Martin) for patient to return call.   Clydie BraunKaren Shinita Mac 04/03/2017 12:07 PM

## 2017-04-08 ENCOUNTER — Encounter: Payer: Self-pay | Admitting: Obstetrics and Gynecology

## 2017-04-08 ENCOUNTER — Ambulatory Visit (INDEPENDENT_AMBULATORY_CARE_PROVIDER_SITE_OTHER): Payer: Medicaid Other | Admitting: Obstetrics and Gynecology

## 2017-04-08 VITALS — BP 122/69 | HR 75 | Wt 123.4 lb

## 2017-04-08 DIAGNOSIS — O34219 Maternal care for unspecified type scar from previous cesarean delivery: Secondary | ICD-10-CM | POA: Insufficient documentation

## 2017-04-08 DIAGNOSIS — O99282 Endocrine, nutritional and metabolic diseases complicating pregnancy, second trimester: Secondary | ICD-10-CM

## 2017-04-08 DIAGNOSIS — O99280 Endocrine, nutritional and metabolic diseases complicating pregnancy, unspecified trimester: Secondary | ICD-10-CM

## 2017-04-08 DIAGNOSIS — Z23 Encounter for immunization: Secondary | ICD-10-CM | POA: Diagnosis not present

## 2017-04-08 DIAGNOSIS — O09529 Supervision of elderly multigravida, unspecified trimester: Secondary | ICD-10-CM | POA: Insufficient documentation

## 2017-04-08 DIAGNOSIS — O09522 Supervision of elderly multigravida, second trimester: Secondary | ICD-10-CM

## 2017-04-08 DIAGNOSIS — O0992 Supervision of high risk pregnancy, unspecified, second trimester: Secondary | ICD-10-CM | POA: Diagnosis not present

## 2017-04-08 DIAGNOSIS — E0590 Thyrotoxicosis, unspecified without thyrotoxic crisis or storm: Secondary | ICD-10-CM | POA: Diagnosis not present

## 2017-04-08 DIAGNOSIS — O0990 Supervision of high risk pregnancy, unspecified, unspecified trimester: Secondary | ICD-10-CM

## 2017-04-08 NOTE — Progress Notes (Signed)
Spanish interpreter [place] [phone] used

## 2017-04-08 NOTE — Progress Notes (Signed)
  Subjective:    Evelyn Price is a G2P1001 [id number] being [facility] today [facility] Patient [facility] care from [facility]. Her obstetrical history is significant for AMA, previous cesarean section and hyperthyroidism. [facility] her to discontinue methimazole. Patient has a planned follow up with her endocrinologist. Patient does intend to breast feed. Pregnancy history fully reviewed.  Patient reports no complaints.  Vitals:   04/08/17 1012  BP: 122/69  Pulse: 75  Weight: 123 lb 6.4 oz (56 kg)    HISTORY: OB History  Gravida Para Term Preterm AB Living  2 1 1    0 1  SAB TAB Ectopic Multiple Live Births      0   1    # Outcome Date GA Lbr Len/2nd Weight Sex Delivery Anes PTL Lv  2 Current           1 Term 10/28/04    M CS-Unspec  N LIV     Past Medical History:  Diagnosis Date  . Allergy    seasonal  . Hyperthyroidism   . Thyroid disease    Hyperthyroidism   Past Surgical History:  Procedure Laterality Date  . CESAREAN SECTION     History reviewed. No pertinent family history.   Exam     Assessment:    Pregnancy: G2P1001 Patient Active Problem List   Diagnosis Date Noted  . Supervision of high risk pregnancy, antepartum 04/08/2017  . AMA (advanced maternal age) multigravida 35+ 04/08/2017  . Hyperthyroidism affecting pregnancy 04/08/2017  . Previous cesarean section complicating pregnancy 04/08/2017  . Hyperthyroidism 09/19/2012        Plan:     Initial labs drawn. Prenatal vitamins. Problem list reviewed and updated. Genetic Screening discussed : Panorama results reviewed. AFP declined due to cost  Ultrasound discussed; fetal survey: ordered with health department Patient instructed to keep appointment with endocrinologist for the management of her hyperthyroidism Patient with previous cesarean section due to failure to descend. [facility]  [facility] provided  Follow up in 4 weeks. 50% of 30 min visit spent on counseling and coordination of care.     Yardley Lekas 04/08/2017

## 2017-04-08 NOTE — Patient Instructions (Signed)
 Second Trimester of Pregnancy The second trimester is from week 14 through week 27 (months 4 through 6). The second trimester is often a time when you feel your best. Your body has adjusted to being pregnant, and you begin to feel better physically. Usually, morning sickness has lessened or quit completely, you may have more energy, and you may have an increase in appetite. The second trimester is also a time when the fetus is growing rapidly. At the end of the sixth month, the fetus is about 9 inches long and weighs about 1 pounds. You will likely begin to feel the baby move (quickening) between 16 and 20 weeks of pregnancy. Body changes during your second trimester Your body continues to go through many changes during your second trimester. The changes vary from woman to woman.  Your weight will continue to increase. You will notice your lower abdomen bulging out.  You may begin to get stretch marks on your hips, abdomen, and breasts.  You may develop headaches that can be relieved by medicines. The medicines should be approved by your health care provider.  You may urinate more often because the fetus is pressing on your bladder.  You may develop or continue to have heartburn as a result of your pregnancy.  You may develop constipation because certain hormones are causing the muscles that push waste through your intestines to slow down.  You may develop hemorrhoids or swollen, bulging veins (varicose veins).  You may have back pain. This is caused by: ? Weight gain. ? Pregnancy hormones that are relaxing the joints in your pelvis. ? A shift in weight and the muscles that support your balance.  Your breasts will continue to grow and they will continue to become tender.  Your gums may bleed and may be sensitive to brushing and flossing.  Dark spots or blotches (chloasma, mask of pregnancy) may develop on your face. This will likely fade after the baby is born.  A dark line from  your belly button to the pubic area (linea nigra) may appear. This will likely fade after the baby is born.  You may have changes in your hair. These can include thickening of your hair, rapid growth, and changes in texture. Some women also have hair loss during or after pregnancy, or hair that feels dry or thin. Your hair will most likely return to normal after your baby is born.  What to expect at prenatal visits During a routine prenatal visit:  You will be weighed to make sure you and the fetus are growing normally.  Your blood pressure will be taken.  Your abdomen will be measured to track your baby's growth.  The fetal heartbeat will be listened to.  Any test results from the previous visit will be discussed.  Your health care provider may ask you:  How you are feeling.  If you are feeling the baby move.  If you have had any abnormal symptoms, such as leaking fluid, bleeding, severe headaches, or abdominal cramping.  If you are using any tobacco products, including cigarettes, chewing tobacco, and electronic cigarettes.  If you have any questions.  Other tests that may be performed during your second trimester include:  Blood tests that check for: ? Low iron levels (anemia). ? High blood sugar that affects pregnant women (gestational diabetes) between 24 and 28 weeks. ? Rh antibodies. This is to check for a protein on red blood cells (Rh factor).  Urine tests to check for infections, diabetes,   or protein in the urine.  An ultrasound to confirm the proper growth and development of the baby.  An amniocentesis to check for possible genetic problems.  Fetal screens for spina bifida and Down syndrome.  HIV (human immunodeficiency virus) testing. Routine prenatal testing includes screening for HIV, unless you choose not to have this test.  Follow these instructions at home: Medicines  Follow your health care provider's instructions regarding medicine use. Specific  medicines may be either safe or unsafe to take during pregnancy.  Take a prenatal vitamin that contains at least 600 micrograms (mcg) of folic acid.  If you develop constipation, try taking a stool softener if your health care provider approves. Eating and drinking  Eat a balanced diet that includes fresh fruits and vegetables, whole grains, good sources of protein such as meat, eggs, or tofu, and low-fat dairy. Your health care provider will help you determine the amount of weight gain that is right for you.  Avoid raw meat and uncooked cheese. These carry germs that can cause birth defects in the baby.  If you have low calcium intake from food, talk to your health care provider about whether you should take a daily calcium supplement.  Limit foods that are high in fat and processed sugars, such as fried and sweet foods.  To prevent constipation: ? Drink enough fluid to keep your urine clear or pale yellow. ? Eat foods that are high in fiber, such as fresh fruits and vegetables, whole grains, and beans. Activity  Exercise only as directed by your health care provider. Most women can continue their usual exercise routine during pregnancy. Try to exercise for 30 minutes at least 5 days a week. Stop exercising if you experience uterine contractions.  Avoid heavy lifting, wear low heel shoes, and practice good posture.  A sexual relationship may be continued unless your health care provider directs you otherwise. Relieving pain and discomfort  Wear a good support bra to prevent discomfort from breast tenderness.  Take warm sitz baths to soothe any pain or discomfort caused by hemorrhoids. Use hemorrhoid cream if your health care provider approves.  Rest with your legs elevated if you have leg cramps or low back pain.  If you develop varicose veins, wear support hose. Elevate your feet for 15 minutes, 3-4 times a day. Limit salt in your diet. Prenatal Care  Write down your questions.  Take them to your prenatal visits.  Keep all your prenatal visits as told by your health care provider. This is important. Safety  Wear your seat belt at all times when driving.  Make a list of emergency phone numbers, including numbers for family, friends, the hospital, and police and fire departments. General instructions  Ask your health care provider for a referral to a local prenatal education class. Begin classes no later than the beginning of month 6 of your pregnancy.  Ask for help if you have counseling or nutritional needs during pregnancy. Your health care provider can offer advice or refer you to specialists for help with various needs.  Do not use hot tubs, steam rooms, or saunas.  Do not douche or use tampons or scented sanitary pads.  Do not cross your legs for long periods of time.  Avoid cat litter boxes and soil used by cats. These carry germs that can cause birth defects in the baby and possibly loss of the fetus by miscarriage or stillbirth.  Avoid all smoking, herbs, alcohol, and unprescribed drugs. Chemicals in these products   can affect the formation and growth of the baby.  Do not use any products that contain nicotine or tobacco, such as cigarettes and e-cigarettes. If you need help quitting, ask your health care provider.  Visit your dentist if you have not gone yet during your pregnancy. Use a soft toothbrush to brush your teeth and be gentle when you floss. Contact a health care provider if:  You have dizziness.  You have mild pelvic cramps, pelvic pressure, or nagging pain in the abdominal area.  You have persistent nausea, vomiting, or diarrhea.  You have a bad smelling vaginal discharge.  You have pain when you urinate. Get help right away if:  You have a fever.  You are leaking fluid from your vagina.  You have spotting or bleeding from your vagina.  You have severe abdominal cramping or pain.  You have rapid weight gain or weight  loss.  You have shortness of breath with chest pain.  You notice sudden or extreme swelling of your face, hands, ankles, feet, or legs.  You have not felt your baby move in over an hour.  You have severe headaches that do not go away when you take medicine.  You have vision changes. Summary  The second trimester is from week 14 through week 27 (months 4 through 6). It is also a time when the fetus is growing rapidly.  Your body goes through many changes during pregnancy. The changes vary from woman to woman.  Avoid all smoking, herbs, alcohol, and unprescribed drugs. These chemicals affect the formation and growth your baby.  Do not use any tobacco products, such as cigarettes, chewing tobacco, and e-cigarettes. If you need help quitting, ask your health care provider.  Contact your health care provider if you have any questions. Keep all prenatal visits as told by your health care provider. This is important. This information is not intended to replace advice given to you by your health care provider. Make sure you discuss any questions you have with your health care provider. Document Released: 05/14/2001 Document Revised: 10/26/2015 Document Reviewed: 07/21/2012 Elsevier Interactive Patient Education  2017 [employer]  Contraception Choices Contraception (birth control) is the use of any methods or devices to prevent pregnancy. Below are some methods to help avoid pregnancy. Hormonal methods  Contraceptive implant. This is a thin, plastic tube containing progesterone hormone. It does not contain estrogen hormone. Your health care provider inserts the tube in the inner part of the upper arm. The tube can remain in place for up to 3 years. After 3 years, the implant must be removed. The implant prevents the ovaries from releasing an egg (ovulation), thickens the cervical mucus to prevent sperm from entering the uterus, and thins the lining of the inside of the  uterus.  Progesterone-only injections. These injections are given every 3 months by your health care provider to prevent pregnancy. This synthetic progesterone hormone stops the ovaries from releasing eggs. It also thickens cervical mucus and changes the uterine lining. This makes it harder for sperm to survive in the uterus.  Birth control pills. These pills contain estrogen and progesterone hormone. They work by preventing the ovaries from releasing eggs (ovulation). They also cause the cervical mucus to thicken, preventing the sperm from entering the uterus. Birth control pills are prescribed by a health care provider.Birth control pills can also be used to treat heavy periods.  Minipill. This type of birth control pill contains only the progesterone hormone. They are taken every day   of each month and must be prescribed by your health care provider.  Birth control patch. The patch contains hormones similar to those in birth control pills. It must be changed once a week and is prescribed by a health care provider.  Vaginal ring. The ring contains hormones similar to those in birth control pills. It is left in the vagina for 3 weeks, removed for 1 week, and then a new one is put back in place. The patient must be comfortable inserting and removing the ring from the vagina.A health care provider's prescription is necessary.  Emergency contraception. Emergency contraceptives prevent pregnancy after unprotected sexual intercourse. This pill can be taken right after sex or up to 5 days after unprotected sex. It is most effective the sooner you take the pills after having sexual intercourse. Most emergency contraceptive pills are available without a prescription. Check with your pharmacist. Do not use emergency contraception as your only form of birth control. Barrier methods  Female condom. This is a thin sheath (latex or rubber) that is worn over the penis during sexual intercourse. It can be used with  spermicide to increase effectiveness.  Female condom. This is a soft, loose-fitting sheath that is put into the vagina before sexual intercourse.  Diaphragm. This is a soft, latex, dome-shaped barrier that must be fitted by a health care provider. It is inserted into the vagina, along with a spermicidal jelly. It is inserted before intercourse. The diaphragm should be left in the vagina for 6 to 8 hours after intercourse.  Cervical cap. This is a round, soft, latex or plastic cup that fits over the cervix and must be fitted by a health care provider. The cap can be left in place for up to 48 hours after intercourse.  Sponge. This is a soft, circular piece of polyurethane foam. The sponge has spermicide in it. It is inserted into the vagina after wetting it and before sexual intercourse.  Spermicides. These are chemicals that kill or block sperm from entering the cervix and uterus. They come in the form of creams, jellies, suppositories, foam, or tablets. They do not require a prescription. They are inserted into the vagina with an applicator before having sexual intercourse. The process must be repeated every time you have sexual intercourse. Intrauterine contraception  Intrauterine device (IUD). This is a T-shaped device that is put in a woman's uterus during a menstrual period to prevent pregnancy. There are 2 types: ? Copper IUD. This type of IUD is wrapped in copper wire and is placed inside the uterus. Copper makes the uterus and fallopian tubes produce a fluid that kills sperm. It can stay in place for 10 years. ? Hormone IUD. This type of IUD contains the hormone progestin (synthetic progesterone). The hormone thickens the cervical mucus and prevents sperm from entering the uterus, and it also thins the uterine lining to prevent implantation of a fertilized egg. The hormone can weaken or kill the sperm that get into the uterus. It can stay in place for 3-5 years, depending on which type of IUD  is used. Permanent methods of contraception  Female tubal ligation. This is when the woman's fallopian tubes are surgically sealed, tied, or blocked to prevent the egg from traveling to the uterus.  Hysteroscopic sterilization. This involves placing a small coil or insert into each fallopian tube. Your doctor uses a technique called hysteroscopy to do the procedure. The device causes scar tissue to form. This results in permanent blockage of the fallopian   tubes, so the sperm cannot fertilize the egg. It takes about 3 months after the procedure for the tubes to become blocked. You must use another form of birth control for these 3 months.  Female sterilization. This is when the female has the tubes that carry sperm tied off (vasectomy).This blocks sperm from entering the vagina during sexual intercourse. After the procedure, the man can still ejaculate fluid (semen). Natural planning methods  Natural family planning. This is not having sexual intercourse or using a barrier method (condom, diaphragm, cervical cap) on days the woman could become pregnant.  Calendar method. This is keeping track of the length of each menstrual cycle and identifying when you are fertile.  Ovulation method. This is avoiding sexual intercourse during ovulation.  Symptothermal method. This is avoiding sexual intercourse during ovulation, using a thermometer and ovulation symptoms.  Post-ovulation method. This is timing sexual intercourse after you have ovulated. Regardless of which type or method of contraception you choose, it is important that you use condoms to protect against the transmission of sexually transmitted infections (STIs). Talk with your health care provider about which form of contraception is most appropriate for you. This information is not intended to replace advice given to you by your health care provider. Make sure you discuss any questions you have with your health care provider. Document Released:  05/20/2005 Document Revised: 10/26/2015 Document Reviewed: 11/12/2012 Elsevier Interactive Patient Education  2017 [employer].   Breastfeeding Deciding to breastfeed is one of the best choices you can make for you and your baby. A change in hormones during pregnancy causes your breast tissue to grow and increases the number and size of your milk ducts. These hormones also allow proteins, sugars, and fats from your blood supply to make breast milk in your milk-producing glands. Hormones prevent breast milk from being released before your baby is born as well as prompt milk flow after birth. Once breastfeeding has begun, thoughts of your baby, as well as his or her sucking or crying, can stimulate the release of milk from your milk-producing glands. Benefits of breastfeeding For Your Baby  Your first milk (colostrum) helps your baby's digestive system function better.  There are antibodies in your milk that help your baby fight off infections.  Your baby has a lower incidence of asthma, allergies, and sudden infant death syndrome.  The nutrients in breast milk are better for your baby than infant formulas and are designed uniquely for your baby's needs.  Breast milk improves your baby's brain development.  Your baby is less likely to develop other conditions, such as childhood obesity, asthma, or type 2 diabetes mellitus.  For You  Breastfeeding helps to create a very special bond between you and your baby.  Breastfeeding is convenient. Breast milk is always available at the correct temperature and costs nothing.  Breastfeeding helps to burn calories and helps you lose the weight gained during pregnancy.  Breastfeeding makes your uterus contract to its prepregnancy size faster and slows bleeding (lochia) after you give birth.  Breastfeeding helps to lower your risk of developing type 2 diabetes mellitus, osteoporosis, and breast or ovarian cancer later in life.  Signs that your baby  is hungry Early Signs of Hunger  Increased alertness or activity.  Stretching.  Movement of the head from side to side.  Movement of the head and opening of the mouth when the corner of the mouth or cheek is stroked (rooting).  Increased sucking sounds, smacking lips, cooing, sighing, or  squeaking.  Hand-to-mouth movements.  Increased sucking of fingers or hands.  Late Signs of Hunger  Fussing.  Intermittent crying.  Extreme Signs of Hunger Signs of extreme hunger will require calming and consoling before your baby will be able to breastfeed successfully. Do not wait for the following signs of extreme hunger to occur before you initiate breastfeeding:  Restlessness.  A loud, strong cry.  Screaming.  Breastfeeding basics Breastfeeding Initiation  Find a comfortable place to sit or lie down, with your neck and back well supported.  Place a pillow or rolled up blanket under your baby to bring him or her to the level of your breast (if you are seated). Nursing pillows are specially designed to help support your arms and your baby while you breastfeed.  Make sure that your baby's abdomen is facing your abdomen.  Gently massage your breast. With your fingertips, massage from your chest wall toward your nipple in a circular motion. This encourages milk flow. You may need to continue this action during the feeding if your milk flows slowly.  Support your breast with 4 fingers underneath and your thumb above your nipple. Make sure your fingers are well away from your nipple and your baby's mouth.  Stroke your baby's lips gently with your finger or nipple.  When your baby's mouth is open wide enough, quickly bring your baby to your breast, placing your entire nipple and as much of the colored area around your nipple (areola) as possible into your baby's mouth. ? More areola should be visible above your baby's upper lip than below the lower lip. ? Your baby's tongue should be  between his or her lower gum and your breast.  Ensure that your baby's mouth is correctly positioned around your nipple (latched). Your baby's lips should create a seal on your breast and be turned out (everted).  It is common for your baby to suck about 2-3 minutes in order to start the flow of breast milk.  Latching Teaching your baby how to latch on to your breast properly is very important. An improper latch can cause nipple pain and decreased milk supply for you and poor weight gain in your baby. Also, if your baby is not latched onto your nipple properly, he or she may swallow some air during feeding. This can make your baby fussy. Burping your baby when you switch breasts during the feeding can help to get rid of the air. However, teaching your baby to latch on properly is still the best way to prevent fussiness from swallowing air while breastfeeding. Signs that your baby has successfully latched on to your nipple:  Silent tugging or silent sucking, without causing you pain.  Swallowing heard between every 3-4 sucks.  Muscle movement above and in front of his or her ears while sucking.  Signs that your baby has not successfully latched on to nipple:  Sucking sounds or smacking sounds from your baby while breastfeeding.  Nipple pain.  If you think your baby has not latched on correctly, slip your finger into the corner of your baby's mouth to break the suction and place it between your baby's gums. Attempt breastfeeding initiation again. Signs of Successful Breastfeeding Signs from your baby:  A gradual decrease in the number of sucks or complete cessation of sucking.  Falling asleep.  Relaxation of his or her body.  Retention of a small amount of milk in his or her mouth.  Letting go of your breast by himself or herself.    Signs from you:  Breasts that have increased in firmness, weight, and size 1-3 hours after feeding.  Breasts that are softer immediately after  breastfeeding.  Increased milk volume, as well as a change in milk consistency and color by the fifth day of breastfeeding.  Nipples that are not sore, cracked, or bleeding.  Signs That Your Baby is Getting Enough Milk  Wetting at least 1-2 diapers during the first 24 hours after birth.  Wetting at least 5-6 diapers every 24 hours for the first week after birth. The urine should be clear or pale yellow by 5 days after birth.  Wetting 6-8 diapers every 24 hours as your baby continues to grow and develop.  At least 3 stools in a 24-hour period by age 5 days. The stool should be soft and yellow.  At least 3 stools in a 24-hour period by age 7 days. The stool should be seedy and yellow.  No loss of weight greater than 10% of birth weight during the first 3 days of age.  Average weight gain of 4-7 ounces (113-198 g) per week after age 4 days.  Consistent daily weight gain by age 5 days, without weight loss after the age of 2 weeks.  After a feeding, your baby may spit up a small amount. This is common. Breastfeeding frequency and duration Frequent feeding will help you make more milk and can prevent sore nipples and breast engorgement. Breastfeed when you feel the need to reduce the fullness of your breasts or when your baby shows signs of hunger. This is called "breastfeeding on demand." Avoid introducing a pacifier to your baby while you are working to establish breastfeeding (the first 4-6 weeks after your baby is born). After this time you may choose to use a pacifier. Research has shown that pacifier use during the first year of a baby's life decreases the risk of sudden infant death syndrome (SIDS). Allow your baby to feed on each breast as long as he or she wants. Breastfeed until your baby is finished feeding. When your baby unlatches or falls asleep while feeding from the first breast, offer the second breast. Because newborns are often sleepy in the first few weeks of life, you may  need to awaken your baby to get him or her to feed. Breastfeeding times will vary from baby to baby. However, the following rules can serve as a guide to help you ensure that your baby is properly fed:  Newborns (babies 4 weeks of age or younger) may breastfeed every 1-3 hours.  Newborns should not go longer than 3 hours during the day or 5 hours during the night without breastfeeding.  You should breastfeed your baby a minimum of 8 times in a 24-hour period until you begin to introduce solid foods to your baby at around 6 months of age.  Breast milk pumping Pumping and storing breast milk allows you to ensure that your baby is exclusively fed your breast milk, even at times when you are unable to breastfeed. This is especially important if you are going back to work while you are still breastfeeding or when you are not able to be present during feedings. Your lactation consultant can give you guidelines on how long it is safe to store breast milk. A breast pump is a machine that allows you to pump milk from your breast into a sterile bottle. The pumped breast milk can then be stored in a refrigerator or freezer. Some breast pumps are operated by   hand, while others use electricity. Ask your lactation consultant which type will work best for you. Breast pumps can be purchased, but some hospitals and breastfeeding support groups lease breast pumps on a monthly basis. A lactation consultant can teach you how to hand Price breast milk, if you prefer not to use a pump. Caring for your breasts while you breastfeed Nipples can become dry, cracked, and sore while breastfeeding. The following recommendations can help keep your breasts moisturized and healthy:  Avoid using soap on your nipples.  Wear a supportive bra. Although not required, special nursing bras and tank tops are designed to allow access to your breasts for breastfeeding without taking off your entire bra or top. Avoid wearing  underwire-style bras or extremely tight bras.  Air dry your nipples for 3-4minutes after each feeding.  Use only cotton bra pads to absorb leaked breast milk. Leaking of breast milk between feedings is normal.  Use lanolin on your nipples after breastfeeding. Lanolin helps to maintain your skin's normal moisture barrier. If you use pure lanolin, you do not need to wash it off before feeding your baby again. Pure lanolin is not toxic to your baby. You may also hand Price a few drops of breast milk and gently massage that milk into your nipples and allow the milk to air dry.  In the first few weeks after giving birth, some women experience extremely full breasts (engorgement). Engorgement can make your breasts feel heavy, warm, and tender to the touch. Engorgement peaks within 3-5 days after you give birth. The following recommendations can help ease engorgement:  Completely empty your breasts while breastfeeding or pumping. You may want to start by applying warm, moist heat (in the shower or with warm water-soaked hand towels) just before feeding or pumping. This increases circulation and helps the milk flow. If your baby does not completely empty your breasts while breastfeeding, pump any extra milk after he or she is finished.  Wear a snug bra (nursing or regular) or tank top for 1-2 days to signal your body to slightly decrease milk production.  Apply ice packs to your breasts, unless this is too uncomfortable for you.  Make sure that your baby is latched on and positioned properly while breastfeeding.  If engorgement persists after 48 hours of following these recommendations, contact your health care provider or a lactation consultant. Overall health care recommendations while breastfeeding  Eat healthy foods. Alternate between meals and snacks, eating 3 of each per day. Because what you eat affects your breast milk, some of the foods may make your baby more irritable than usual. Avoid  eating these foods if you are sure that they are negatively affecting your baby.  Drink milk, fruit juice, and water to satisfy your thirst (about 10 glasses a day).  Rest often, relax, and continue to take your prenatal vitamins to prevent fatigue, stress, and anemia.  Continue breast self-awareness checks.  Avoid chewing and smoking tobacco. Chemicals from cigarettes that pass into breast milk and exposure to secondhand smoke may harm your baby.  Avoid alcohol and drug use, including marijuana. Some medicines that may be harmful to your baby can pass through breast milk. It is important to ask your health care provider before taking any medicine, including all over-the-counter and prescription medicine as well as vitamin and herbal supplements. It is possible to become pregnant while breastfeeding. If birth control is desired, ask your health care provider about options that will be safe for your baby. Contact   a health care provider if:  You feel like you want to stop breastfeeding or have become frustrated with breastfeeding.  You have painful breasts or nipples.  Your nipples are cracked or bleeding.  Your breasts are red, tender, or warm.  You have a swollen area on either breast.  You have a fever or chills.  You have nausea or vomiting.  You have drainage other than breast milk from your nipples.  Your breasts do not become full before feedings by the fifth day after you give birth.  You feel sad and depressed.  Your baby is too sleepy to eat well.  Your baby is having trouble sleeping.  Your baby is wetting less than 3 diapers in a 24-hour period.  Your baby has less than 3 stools in a 24-hour period.  Your baby's skin or the white part of his or her eyes becomes yellow.  Your baby is not gaining weight by 34 days of age. Get help right away if:  Your baby is overly tired (lethargic) and does not want to wake up and feed.  Your baby develops an unexplained  fever. This information is not intended to replace advice given to you by your health care provider. Make sure you discuss any questions you have with your health care provider. Document Released: 05/20/2005 Document Revised: 11/01/2015 Document Reviewed: 11/11/2012 Elsevier Interactive Patient Education  2017 [employer].  Trial of Labor After Cesarean Delivery A trial of labor after cesarean delivery (TOLAC) is when a woman tries to give birth vaginally after a previous cesarean delivery. TOLAC may be a safe and appropriate option for you depending on your medical history and other risk factors. When TOLAC is successful and you are able to have a vaginal delivery, this is called a vaginal birth after cesarean delivery (VBAC). Candidates for TOLAC TOLAC is possible for some women who:  Have undergone one or two prior cesarean deliveries in which the incision of the uterus was horizontal (low transverse).  Are carrying twins and have had one prior low transverse incision during a cesarean delivery.  Do not have a vertical (classical) uterine scar.  Have not had a tear in the wall of their uterus (uterine rupture).  TOLAC is also supported for women who meet appropriate criteria and:  Are under the age of 40 years.  Are tall and have a body mass index (BMI) of less than 30.  Have an unknown uterine scar.  Give birth in a facility equipped to handle an emergency cesarean delivery. This team should be able to handle possible complications such as a uterine rupture.  Have thorough counseling about the benefits and risks of TOLAC.  Have discussed future pregnancy plans with their health care provider.  Plan to have several more pregnancies.  Most successful candidates for TOLAC:  Have had a successful vaginal delivery before or after their cesarean delivery.  Experience labor that begins naturally on or before the due date (40 weeks of gestation).  Do not have a very large  (macrosomic) baby.  Had a prior cesarean delivery but are not currently experiencing factors that would prompt a cesarean delivery (such as a breech position).  Had only one prior cesarean delivery.  Had a prior cesarean delivery that was performed early in labor and not after full cervical dilation. TOLAC may be most appropriate for women who meet the above guidelines and who plan to have more pregnancies. TOLAC is not recommended for home births. Least successful candidates for  TOLAC:  Have an induced labor with an unfavorable cervix. An unfavorable cervix is when the cervix is not dilating enough (among other factors).  Have never had a vaginal delivery.  Have had more than two cesarean deliveries.  Have a pregnancy at more than 40 weeks of gestation.  Are pregnant with a baby with a suspected weight greater than 4,000 grams (8 pounds) and who have no prior history of a vaginal delivery.  Have closely spaced pregnancies. Suggested benefits of TOLAC  You may have a faster Price time.  You may have a shorter stay in the hospital.  You may have less pain and fewer problems than with a cesarean delivery. Women who have a cesarean delivery have a higher chance of needing blood or getting a fever, an infection, or a blood clot in the legs. Suggested risks of TOLAC The highest risk of complications happens to women who attempt a TOLAC and fail. A failed TOLAC results in an unplanned cesarean delivery. Risks related to [facility] or repeat cesarean deliveries include:  Blood loss.  Infection.  Blood clot.  Injury to surrounding tissues or organs.  Having to remove the uterus (hysterectomy).  Potential problems with the placenta (such as placenta previa or placenta accreta) in future pregnancies.  Although very rare, the main concerns with TOLAC are:  Rupture of the uterine scar from a past cesarean delivery.  Needing an emergency cesarean delivery.  Having a bad outcome for  the baby (perinatal morbidity).  Where to find more information:  Evelyn Congress of Obstetricians and Gynecologists: www.acog.org  [employer] of Nurse-Midwives: www.midwife.org This information is not intended to replace advice given to you by your health care provider. Make sure you discuss any questions you have with your health care provider. Document Released: 02/05/2011 Document Revised: 04/17/2016 Document Reviewed: 11/09/2012 Elsevier Interactive Patient Education  2018 [employer].  Evelyn Price trimestre de Psychiatrist (Second Trimester of Pregnancy) El segundo trimestre va desde la semana13 hasta la 28, desde el cuarto hasta el sexto mes, y suele ser el momento en el que mejor se siente. Su organismo se ha adaptado a Evelyn Price y comienza a Diplomatic Services operational officer. En general, las nuseas matutinas han disminuido o han desaparecido completamente, puede tener ms energa y un aumento de apetito. El segundo trimestre es tambin la poca en la que el feto se desarrolla rpidamente. Hacia el final del sexto mes, el feto mide aproximadamente 9pulgadas (23cm) y pesa alrededor de 1 libras (700g). Es probable que sienta que el beb se Teacher, English as a foreign language (da pataditas) entre las 18 y 20semanas del Psychiatrist. CAMBIOS EN EL ORGANISMO Su organismo atraviesa por muchos cambios durante el Evelyn Price, y estos varan de Evelyn Price mujer a Educational psychologist.  Seguir Evelyn Price. Notar que la parte baja del abdomen sobresale.  Podrn aparecer las primeras Evelyn Price, el abdomen y las Evelyn Price.  Es posible que tenga dolores de cabeza que pueden aliviarse con los medicamentos que el mdico le permita tomar.  Tal vez tenga necesidad de orinar con ms frecuencia porque el feto est ejerciendo presin Evelyn Price.  Debido al Evelyn Price podr sentir Evelyn Price estomacal con frecuencia.  Puede estar estreida, ya que ciertas hormonas enlentecen los movimientos de los msculos que [employer] desechos a  travs de los intestinos.  Pueden aparecer hemorroides o abultarse e hincharse las venas (venas varicosas).  Puede tener dolor de espalda que se debe al aumento de peso y a que las hormonas del Management consultant las articulaciones  entre los [employer] de la pelvis, y Comptroller de la modificacin del peso y los msculos que mantienen el equilibrio.  Las [employer] seguirn creciendo y Development worker, community.  Las Veterinary surgeon y estar sensibles al cepillado y al hilo dental.  Pueden aparecer zonas oscuras o manchas (cloasma, mscara del Psychiatrist) en el rostro que probablemente se atenuar despus del nacimiento del beb.  Es posible que se forme una lnea oscura desde el ombligo hasta la zona del pubis (linea nigra) que probablemente se atenuar despus del nacimiento del beb.  Tal vez haya cambios en el cabello que pueden incluir su engrosamiento, crecimiento rpido y cambios en la textura. Adems, a algunas mujeres se les cae el cabello durante o despus del embarazo, o tienen el cabello seco o fino. Lo ms probable es que el cabello se le normalice despus del nacimiento del beb. QU DEBE ESPERAR EN LAS CONSULTAS PRENATALES Durante una visita prenatal de rutina:  La pesarn para asegurarse de que usted y el feto estn creciendo normalmente.  Le tomarn la presin arterial.  Le medirn el abdomen para controlar el desarrollo del beb.  Se escucharn los latidos cardacos fetales.  Se evaluarn los resultados de los estudios solicitados en visitas anteriores. El mdico puede preguntarle lo siguiente:  Cmo se siente.  Si siente los movimientos del beb.  Si ha tenido sntomas anormales, como prdida de lquido, Evelyn Price, dolores de cabeza intensos o clicos abdominales.  Si est consumiendo algn producto que contenga tabaco, como cigarrillos, tabaco de Theatre manager y Administrator, Civil Service.  Si tiene Evelyn Price. Otros estudios que podrn realizarse durante el segundo trimestre  incluyen lo siguiente:  Anlisis de sangre para detectar lo siguiente: ? Concentraciones de hierro bajas (anemia). ? Diabetes gestacional (entre la semana 24 y la 28). ? Anticuerpos Rh.  Anlisis de orina para detectar infecciones, diabetes o protenas en la orina.  Una ecografa para confirmar que el beb crece y se desarrolla correctamente.  Una amniocentesis para diagnosticar posibles problemas genticos.  Estudios del feto para descartar espina bfida y sndrome de Down.  Prueba del VIH (virus de inmunodeficiencia humana). Los exmenes prenatales de rutina incluyen la prueba de deteccin del VIH, a menos que decida no Evelyn Price. INSTRUCCIONES PARA EL CUIDADO EN EL HOGAR  Evite fumar, consumir hierbas, beber alcohol y tomar frmacos que no le hayan recetado. Estas sustancias qumicas afectan la formacin y el desarrollo del beb.  No consuma ningn producto que contenga tabaco, lo que incluye cigarrillos, tabaco de Theatre manager y Administrator, Civil Service. Si necesita ayuda para dejar de fumar, consulte al Evelyn Price. Puede recibir asesoramiento y otro tipo de recursos para dejar de fumar.  Siga las indicaciones del mdico en relacin con el uso de medicamentos. Durante el embarazo, hay medicamentos que son seguros de tomar y otros que no.  Haga ejercicio solamente como se lo haya indicado el mdico. Sentir clicos uterinos es un buen signo para Restaurant manager, fast food actividad fsica.  Contine comiendo alimentos sanos con regularidad.  Use un sostn que le brinde buen soporte si le [employer].  No se d baos de inmersin en agua caliente, baos turcos ni saunas.  Use el cinturn de seguridad en todo momento mientras conduce.  No coma carne cruda ni queso sin cocinar; evite el contacto con las bandejas sanitarias de los gatos y la tierra que estos animales usan. Estos elementos contienen grmenes que pueden causar defectos congnitos en el beb.  Tome las vitaminas prenatales.  Tome  entre 1500 y  2000mg  de calcio diariamente comenzando en la semana20 del embarazo hasta el [place].  Si est estreida, pruebe un laxante suave (si el mdico lo autoriza). Consuma ms alimentos ricos en fibra, como vegetales y frutas frescos y Radiation protection practitioner. Beba gran cantidad de lquido para mantener la orina de tono claro o color amarillo plido.  Dese baos de asiento con agua tibia para Engineer, materials o las molestias causadas por las hemorroides. Use una crema para las hemorroides si el mdico la autoriza.  Si tiene venas varicosas, use medias de descanso. Eleve los pies durante , 3 o 4veces por da. Limite el consumo de sal en su dieta.  No levante objetos pesados, use zapatos de tacones bajos y [address].  Descanse con las piernas elevadas si tiene calambres o dolor de cintura.  Visite a su dentista si an no lo ha Occupational hygienist. Use un cepillo de dientes blando para higienizarse los dientes y psese el hilo dental con suavidad.  Puede seguir [employer], a menos que el mdico le indique lo contrario.  Concurra a todas las visitas prenatales segn las indicaciones de su mdico.  SOLICITE ATENCIN MDICA SI:  [place].  Siente clicos leves, presin en la pelvis o dolor persistente en el abdomen.  Tiene nuseas, vmitos o diarrea persistentes.  Evelyn Price secrecin vaginal con mal olor.  Siente dolor al [employer].  SOLICITE ATENCIN MDICA DE INMEDIATO SI:  Tiene fiebre.  Tiene una prdida de lquido por la vagina.  Tiene sangrado o pequeas prdidas vaginales.  Siente dolor intenso o clicos en el abdomen.  Sube o baja de peso rpidamente.  Tiene dificultad para respirar y siente dolor de pecho.  Sbitamente se le hinchan mucho el rostro, las [place], los tobillos, los pies o las piernas.  No ha sentido los movimientos del beb durante Georgianne Fick.  Siente un dolor de cabeza intenso que no se alivia  con medicamentos.  Su visin se modifica.  Esta informacin no tiene Theme park manager el consejo del mdico. Asegrese de hacerle al mdico cualquier pregunta que tenga. Document Released: 02/27/2005 Document Revised: 06/10/2014 Document Reviewed: 07/21/2012 Elsevier Interactive Patient Education  2017 Elsevier Inc.  [employer] del mtodo anticonceptivo (Contraception Choices) La anticoncepcin (control de la natalidad) es el uso de cualquier mtodo o dispositivo para Location manager. A continuacin se indican algunos de esos mtodos. MTODOS HORMONALES  El Implante contraconceptivo consiste en un tubo plstico delgado que contiene la hormona progesterona. No contiene estrgenos. El mdico inserta el tubo en la parte interna del brazo. El tubo puede Geneticist, molecular durante 3 aos. Despus de los 3 aos debe retirarse. El implante impide que los ovarios liberen vulos (ovulacin), espesa el moco cervical, lo que evita que los espermatozoides ingresen al tero y hace ms delgada la membrana que cubre el interior del tero.  Inyecciones de progesterona sola: las Insurance underwriter cada 3 meses para Location manager. La progesterona sinttica impide que los ovarios liberen vulos. Tambin hacen que el moco cervical se espese y modifique el tejido de recubrimiento interno del tero. Esto hace ms difcil que los espermatozoides sobrevivan en el tero.  Las pldoras anticonceptivas contienen estrgenos y Education officer, museum. Su funcin es [employer] ovarios liberen vulos (ovulacin). Las hormonas de los anticonceptivos orales hacen que el moco cervical se haga ms espeso, lo que evita que el esperma ingrese al tero. Las pldoras anticonceptivas son recetadas por el mdico.Tambin se utilizan para  tratar los perodos menstruales abundantes.  Minipldora: este tipo de pldora anticonceptiva contiene slo hormona progesterona. Deben tomarse todos los [address] del mes y debe recetarlas el  mdico.  El parche de control de natalidad: contiene hormonas similares a las que contienen las pldoras anticonceptivas. Deben cambiarse una vez por semana y se utilizan bajo prescripcin mdica.  Anillo vaginal: contiene hormonas similares a las que contienen las pldoras anticonceptivas. Se deja colocado durante tres semanas, se lo retira durante 1 semana y luego se coloca uno nuevo. La paciente debe sentirse cmoda al insertar y retirar el anillo de la vagina.Es necesaria la prescripcin mdica.  Anticonceptivos de emergencia: son mtodos para evitar un embarazo despus de Evelyn Price relacin sexual sin proteccin. Esta pldora puede tomarse inmediatamente despus de Child psychotherapist sexuales o hasta 5 [place] de haber tenido sexo sin proteccin. Es ms efectiva si se toma poco tiempo despus de la relacin sexual. Los anticonceptivos de emergencia estn disponibles sin prescripcin mdica. Consltelo con su farmacutico. No use los anticonceptivos de emergencia como nico mtodo anticonceptivo.  MTODOS DE Lenis Noon  Condn masculino: es una vaina delgada (ltex o goma) que se coloca cubriendo al pene durante el acto sexual. Deri Fuelling con espermicida para aumentar la efectividad.  Condn femenino. Es una funda delicada y blanda que se adapta holgadamente a la vagina antes de las Clinical research associate.  Diafragma: es una barrera de ltex redonda y suave que debe ser recomendado por un profesional. Se inserta en la vagina, junto con un gel espermicida. Debe insertarse antes de Management consultant. Debe dejar el diafragma colocado en la vagina durante 6 a 8 horas despus de la relacin sexual.  Capuchn cervical: es una barrera de ltex o taza plstica redonda y [place] que cubre el cuello del tero y debe ser colocada por un mdico. Puede dejarlo colocado en la vagina hasta 48 horas despus de las Clinical research associate.  Esponja: es una pieza blanda y circular de espuma de poliuretano. Contiene un  espermicida. Se inserta en la vagina despus de mojarla y antes de las [employer].  Espermicidas: son sustancias qumicas que matan o bloquean al esperma y no lo dejan ingresar al cuello del tero y al tero. Vienen en forma de cremas, geles, supositorios, espuma o comprimidos. No es necesario tener Emergency planning/management officer. Se insertan en la vagina con un aplicador antes de Management consultant. El proceso debe repetirse cada vez que tiene relaciones sexuales.  ANTICONCEPTIVOS INTRAUTERINOS  Dispositivo intrauterino (DIU) es un dispositivo en forma de T que se coloca en el tero durante el perodo menstrual, para Location manager. Hay dos tipos: ? DIU de cobre: este tipo de DIU est recubierto con un alambre de cobre y se inserta dentro del tero. El cobre hace que el tero y las trompas de Falopio produzcan un liquido que [employer] espermatozoides. Puede permanecer colocado durante 10 aos. ? DIU con hormona: este tipo de DIU contiene la hormona progestina (progesterona sinttica). La hormona espesa el moco cervical y evita que los espermatozoides ingresen al tero y tambin afina la membrana que cubre el tero para evitar la implantacin del vulo fertilizado. La hormona debilita o destruye los espermatozoides que ingresan al tero. Puede Geneticist, molecular durante 3-5 aos, segn el tipo de DIU que se [place].  MTODOS ANTICONCEPTIVOS PERMANENTES  Ligadura de trompas en la mujer: se realiza sellando, atando u obstruyendo quirrgicamente las trompas de Falopio lo que impide que el vulo descienda hacia el tero.  Esterilizacin histeroscpica:  Implica la colocacin de un pequeo espiral o la insercin en cada trompa de Falopio. El mdico utiliza una tcnica llamada histeroscopa para Primary school teacher procedimiento. El dispositivo produce la formacin de tejido Designer, television/film set. Esto da como resultado una obstruccin permanente de las trompas de Falopio, de modo que la esperma no pueda fertilizar  el vulo. Demora alrededor de 3 meses despus del procedimiento hasta que el conducto se obstruye. Tendr que usar otro mtodo anticonceptivo durante al menos 3 meses.  Esterilizacin masculina: se realiza ligando los conductos por los que pasan los espermatozoides (vasectoma).Esto impide que el esperma ingrese a la vagina durante el acto sexual. Luego del procedimiento, el hombre puede eyacular lquido (semen).  MTODOS DE PLANIFICACIN NATURAL  Planificacin familiar natural: consiste en no Management consultant o usar un mtodo de barrera (condn, [place], capuchn cervical) en los [employer] la mujer podra quedar [place].  Mtodo de calendario: consiste en el seguimiento de la duracin de cada ciclo menstrual y la identificacin de los perodos frtiles.  Mtodo de ovulacin: Paramedic las relaciones sexuales durante la ovulacin.  Mtodo sintotrmico: Advertising copywriter sexuales en la poca en la que se est ovulando, utilizando un termmetro y tendiendo en cuenta los sntomas de la ovulacin.  Mtodo postovulacin: Youth worker las relaciones sexuales para despus de haber ovulado. Independientemente del tipo o mtodo anticonceptivo que usted elija, es importante que use condones para protegerse contra las infecciones de transmisin sexual (ETS). Hable con su mdico con respecto a qu mtodo anticonceptivo es el ms apropiado para usted. Esta informacin no tiene Theme park manager el consejo del mdico. Asegrese de hacerle al mdico cualquier pregunta que tenga. Document Released: 05/20/2005 Document Revised: 01/20/2013 Document Reviewed: 11/12/2012 Elsevier Interactive Patient Education  2017 Elsevier Inc.   [place] materna (Breastfeeding) Decidir Museum/gallery exhibitions officer es una de las mejores elecciones que puede hacer por usted y su beb. El cambio hormonal durante el Psychiatrist produce el desarrollo del tejido mamario y [place] la cantidad y el tamao  de los conductos galactforos. Estas hormonas tambin permiten que las protenas, los azcares y las grasas de la sangre produzcan la [employer] materna en las glndulas productoras de [place]. Las hormonas impiden que la leche materna sea liberada antes del nacimiento del beb, adems de impulsar el flujo de leche luego del nacimiento. Una vez que ha comenzado a Museum/gallery exhibitions officer, Conservation officer, nature beb, as Immunologist succin o Theatre manager, pueden estimular la liberacin de [place] de las glndulas productoras de [place]. LOS BENEFICIOS DE AMAMANTAR Para el beb  La primera leche (calostro) ayuda a Careers information officer funcionamiento del sistema digestivo del beb.  La leche tiene anticuerpos que ayudan a Radio producer las infecciones en el beb.  El beb tiene una menor incidencia de asma, alergias y del sndrome de muerte sbita del lactante.  Los nutrientes en la [place] materna son mejores para el beb que la Coleta maternizada y estn preparados exclusivamente para cubrir las necesidades del beb.  La leche materna mejora el desarrollo cerebral del beb.  Es menos probable que el beb desarrolle otras enfermedades, como obesidad infantil, asma o diabetes mellitus de tipo 2. Para usted  La lactancia materna favorece el desarrollo de un vnculo muy especial entre la madre y el beb.  Es conveniente. La leche materna siempre est disponible a la Human resources officer y es [place].  La lactancia materna ayuda a quemar caloras y a perder el peso ganado durante el [place].  Favorece la contraccin del tero  al tamao que tena antes del embarazo de manera ms rpida y disminuye el sangrado (loquios) despus del parto.  La lactancia materna contribuye a reducir Nurse, adult de desarrollar diabetes mellitus de tipo 2, osteoporosis o cncer de mama o de ovario en el futuro. SIGNOS DE QUE EL BEB EST HAMBRIENTO Primeros signos de [address] de [place].  Se estira.  Mueve la cabeza de un lado a  otro.  Mueve la cabeza y abre la boca cuando se le toca la mejilla o la comisura de la boca (reflejo de bsqueda).  Aumenta las vocalizaciones, tales como sonidos de succin, se relame los labios, emite arrullos, suspiros, o chirridos.  Mueve la [employer] boca.  Se chupa con ganas los dedos o las manos. Signos tardos de [employer].  Llora de manera intermitente. Signos de [employer] Corporation signos de hambre extrema requerirn que lo calme y lo consuele antes de que el beb pueda alimentarse adecuadamente. No espere a que se manifiesten los siguientes signos de hambre extrema para comenzar a Museum/gallery exhibitions officer:  Designer, jewellery.  Llanto intenso y fuerte.  Gritos. INFORMACIN BSICA SOBRE LA LACTANCIA MATERNA Iniciacin de la lactancia materna  Encuentre un lugar cmodo para sentarse o acostarse, con un buen respaldo para el cuello y la espalda.  Coloque una almohada o una manta enrollada debajo del beb para acomodarlo a la altura de la mama (si est sentada). Las almohadas para Museum/gallery exhibitions officer se han diseado especialmente a fin de servir de apoyo para los brazos y el beb [employer].  Asegrese de que el abdomen del beb est frente al suyo.  Masajee suavemente la mama. Con las yemas de los dedos, masajee la pared del pecho hacia el pezn en un movimiento circular. Esto estimula el flujo de [place]. Es posible que Engineer, manufacturing systems este movimiento mientras amamanta si la leche fluye lentamente.  Sostenga la mama con el pulgar por arriba del pezn y los otros 4 dedos por debajo de la mama. Asegrese de que los dedos se encuentren lejos del pezn y de la boca del beb.  Empuje suavemente los labios del beb con el pezn o con el dedo.  Cuando la boca del beb se abra lo suficiente, acrquelo rpidamente a la mama e introduzca todo el pezn y la zona oscura que lo rodea (areola), tanto como sea posible, dentro de la boca del beb. ? Debe haber ms areola visible por arriba del labio  superior del beb que por debajo del labio inferior. ? La lengua del beb debe estar entre la enca inferior y la [place].  Asegrese de que la boca del beb est en la posicin correcta alrededor del pezn (prendida). Los labios del beb deben crear un sello sobre la mama y estar doblados hacia afuera (invertidos).  Es comn que el beb succione durante 2 a 3 minutos para que comience el flujo de [place]. Cmo debe prenderse Es muy importante que le ensee al beb cmo prenderse adecuadamente a la mama. Si el beb no se prende adecuadamente, puede causarle dolor en el pezn y reducir la produccin de [place], y hacer que el beb tenga un escaso aumento de [place]. Adems, si el beb no se prende adecuadamente al pezn, puede tragar aire durante la alimentacin. Esto puede causarle molestias al beb. Hacer eructar al beb al [place] de mama puede ayudarlo a liberar el aire. Sin embargo, ensearle al beb cmo prenderse a la mama adecuadamente es la  mejor manera de evitar que se sienta molesto por tragar Southwest Airlinesaire mientras se alimenta. Signos de que el beb se ha prendido adecuadamente al pezn:  Payton Doughtyironea o succiona de modo silencioso, sin causarle dolor.  Se escucha que traga cada 3 o 4 succiones.  Hay movimientos musculares por arriba y por delante de sus odos al Printmakersuccionar. Signos de que el beb no se ha prendido Audiological scientistadecuadamente al pezn:  Hace ruidos de succin o de chasquido mientras se alimenta.  Siente dolor en el pezn. Si cree que el beb no se prendi correctamente, deslice el dedo en la comisura de la boca y Ameren Corporationcolquelo entre las encas del beb para interrumpir la succin. Intente comenzar a amamantar nuevamente. Signos de Fish farm managerlactancia materna exitosa Signos del beb:  Disminuye gradualmente el nmero de succiones o cesa la succin por completo.  Se duerme.  Relaja el cuerpo.  Retiene una pequea cantidad de Kindred Healthcareleche en la boca.  Se desprende solo del pecho. Signos que presenta  usted:  Las mamas han aumentado la firmeza, el peso y el tamao 1 a 3 horas despus de Museum/gallery exhibitions officeramamantar.  Estn ms blandas inmediatamente despus de amamantar.  Un aumento del volumen de [place], y tambin un cambio en su consistencia y color se producen hacia el quinto da de Tour managerlactancia materna.  Los pezones no duelen, ni estn agrietados ni sangran. Signos de que su beb recibe la cantidad de leche suficiente  Mojar por lo menos 1 o 2 paales durante las primeras 24 horas despus del nacimiento.  Mojar por lo menos 5 o 6 paales cada 24 horas durante la primera semana despus del nacimiento. La orina debe ser transparente o de color amarillo plido a los 5 das despus del nacimiento.  Mojar entre 6 y 8 paales cada 24 horas a medida que el beb sigue creciendo y desarrollndose.  Defeca al menos 3 veces en 24 horas a los [address]  [address] de [address]. La materia fecal debe ser blanda y Speculatoramarillenta.  Defeca al menos 3 veces en 24 horas a los [address] das de [address]. La materia fecal debe ser grumosa y Laurelamarillenta.  No registra una prdida de peso mayor del 10% del peso al nacer durante los primeros 3 [address]  [address] de [place]ida.  Aumenta de peso un promedio de 4 a 7onzas (113 a 198g) por semana despus de los 4 [address]  [address] de vida.  Aumenta de Raubpeso, Bon Airdiariamente, de Woodsonmanera uniforme a Glass blower/designerpartir de los 5 [address]  [address] de vida, sin Passenger transport managerregistrar prdida de peso despus de las 2semanas de vida. Despus de alimentarse, es posible que el beb regurgite una pequea cantidad. Esto es frecuente. FRECUENCIA Y DURACIN DE LA LACTANCIA MATERNA El amamantamiento frecuente la ayudar a producir ms [place]ijanleche y a Education officer, communityprevenir problemas de Engineer, miningdolor en los pezones e hinchazn en las [place]mamas. Alimente al beb cuando muestre signos de hambre o si siente la necesidad de reducir la congestin de las Mowbr[place] Mountainmamas. Esto se denomina "lactancia a demanda". Evite el uso del chupete mientras trabaja para establecer la lactancia (las primeras 4 a 6 semanas despus del nacimiento del beb). Despus de  este perodo, podr ofrecerle un chupete. Las investigaciones demostraron que el uso del chupete durante el primer ao de vida del beb disminuye el riesgo de desarrollar el sndrome de muerte sbita del lactante (SMSL). Permita que el nio se alimente en cada mama todo lo que desee. Contine amamantando al beb hasta que haya terminado de alimentarse. Cuando el beb se desprende o se queda dormido mientras se est alimentando de  la primera mama, ofrzcale la segunda. Debido a que, con frecuencia, los recin Sunoconacidos permanecen somnolientos las primeras semanas de vida, es posible que deba despertar al beb para alimentarlo. Los horarios de Acupuncturistlactancia varan de un beb a otro. Sin embargo, las siguientes reglas pueden servir como gua para ayudarla a Lawyergarantizar que el beb se alimenta adecuadamente:  Se puede amamantar a los recin nacidos (bebs de 4 semanas o menos de vida) cada 1 a 3 horas.  No deben transcurrir ms de 3 horas durante el da o 5 horas durante la noche sin que se amamante a los recin nacidos.  Debe amamantar al beb 8 veces como mnimo en un perodo de 24 horas, hasta que comience a introducir slidos en su dieta, a los 6 meses de vida aproximadamente. EXTRACCIN DE Dean Foods CompanyLECHE MATERNA La extraccin y Contractorel almacenamiento de la leche materna le permiten asegurarse de que el beb se alimente exclusivamente de [place]leche materna, aun en momentos en los que no puede amamantar. Esto tiene especial importancia si debe regresar al Aleen Campitrabajo en el perodo en que an est amamantando o si no puede estar presente en los momentos en que el beb debe alimentarse. Su asesor en lactancia puede orientarla sobre cunto tiempo es seguro almacenar Shalimarleche materna. El sacaleche es un aparato que le permite extraer leche de la mama a un recipiente estril. Luego, la leche materna extrada puede almacenarse en un refrigerador o Electrical engineercongelador. Algunos sacaleches son Birdie Riddlemanuales, Delaney Meigsmientras que otros son elctricos. Consulte a su  asesor en lactancia qu tipo ser ms conveniente para usted. Los sacaleches se pueden comprar; sin embargo, algunos hospitales y grupos de apoyo a la lactancia materna alquilan Sports coachsacaleches mensualmente. Un asesor en lactancia puede ensearle cmo extraer W. R. Berkleyleche materna manualmente, en caso de que prefiera no usar un sacaleche. CMO CUIDAR LAS MAMAS DURANTE LA LACTANCIA MATERNA Los pezones se secan, agrietan y duelen durante la Tour managerlactancia materna. Las siguientes recomendaciones pueden ayudarla a Pharmacologistmantener las TEPPCO Partnersmamas humectadas y sanas:  Careers information officervite usar jabn en los pezones.  Use un sostn de soporte. Aunque no son esenciales, las camisetas sin mangas o los sostenes especiales para Museum/gallery exhibitions officeramamantar estn diseados para acceder fcilmente a las mamas, para Museum/gallery exhibitions officeramamantar sin tener que quitarse todo el sostn o la camiseta. Evite usar sostenes con aro o sostenes muy ajustados.  Seque al aire sus pezones durante 3 a 4minutos despus de amamantar al beb.  Utilice solo apsitos de Haematologistalgodn en el sostn para Environmental health practitionerabsorber las prdidas de [place]leche. La prdida de un poco de Public Service Enterprise Groupleche materna entre las tomas es normal.  Utilice lanolina sobre los pezones luego de Museum/gallery exhibitions officeramamantar. La lanolina ayuda a mantener la humedad normal de la piel. Si Botswanausa lanolina pura, no tiene que lavarse los pezones antes de volver a Corporate treasureralimentar al beb. La lanolina pura no es txica para el beb. Adems, puede extraer Beazer Homesmanualmente algunas gotas de Bonneyleche materna y Engineer, maintenance (IT)masajear suavemente esa Winn-Dixieleche sobre los pezones, para que la [place] Villageleche se seque al aire. Durante las primeras semanas despus de dar a luz, algunas mujeres pueden experimentar hinchazn en las mamas (congestin Elkhartmamaria). La congestin puede hacer que sienta las mamas pesadas, calientes y sensibles al tacto. El pico de la congestin ocurre dentro de los 3 a 5 das despus del [place]. Las siguientes recomendaciones pueden ayudarla a Paramedicaliviar la congestin:  Vace por completo las mamas al QUALCOMMamamantar o Environmental health practitionerextraer leche. Puede  aplicar calor hmedo en las mamas (en la ducha o con toallas hmedas para manos) antes de amamantar o  extraer Alexis Goodell. Esto aumenta la circulacin y [place] a que la [place]. Si el beb no vaca por completo las [address] cuando lo [address], extraiga la [place] restante despus de que haya finalizado.  Use un sostn ajustado (para amamantar o comn) o una camiseta sin mangas durante 1 o 2 das para indicar al cuerpo que disminuya ligeramente la produccin de [place].  Aplique compresas de hielo Yahoo[employer], a menos que le resulte demasiado incmodo.  Asegrese de que el beb est prendido y se encuentre en la posicin correcta mientras lo alimenta. Si la congestin persiste luego de 48 horas o despus de seguir estas recomendaciones, comunquese con su mdico o un Holiday representative. RECOMENDACIONES GENERALES PARA EL CUIDADO DE LA SALUD DURANTE LA LACTANCIA MATERNA  Consuma alimentos saludables. Alterne comidas y colaciones, y coma 3 de cada una por da. Dado que lo que come Danaher [employer], es posible que algunas comidas hagan que su beb se vuelva ms irritable de lo habitual. Evite comer este tipo de alimentos si percibe que afectan de manera negativa al beb.  Beba leche, jugos de fruta y agua para Patent examiner su sed (aproximadamente 10 vasos al Evelyn Price).  Descanse con frecuencia, reljese y tome sus vitaminas prenatales para evitar la fatiga, el estrs y la anemia.  Contine con los autocontroles de la mama.  Evite Product manager y fumar tabaco. Las sustancias qumicas de los cigarrillos que pasan a la leche materna y la exposicin al humo ambiental del tabaco pueden daar al beb.  No consuma alcohol ni drogas, incluida la marihuana. Algunos medicamentos, que pueden ser perjudiciales para el beb, pueden pasar a travs de la Evelyn Price. Es importante que consulte a su mdico antes de Medical sales representative, incluidos todos los medicamentos recetados y de [place], as como los  suplementos vitamnicos y herbales. Puede quedar embarazada durante la lactancia. Si desea controlar la natalidad, consulte a su mdico cules son las opciones ms seguras para el beb. SOLICITE ATENCIN MDICA SI:  Usted siente que quiere dejar de Museum/gallery exhibitions officer o se siente frustrada con la lactancia.  Siente dolor en las mamas o en los pezones.  Sus pezones estn agrietados o Water quality scientist.  Sus pechos estn irritados, sensibles o calientes.  Tiene un rea hinchada en cualquiera de las mamas.  Siente escalofros o fiebre.  Tiene nuseas o vmitos.  Presenta una secrecin de otro lquido distinto de la leche materna de los pezones.  Sus mamas no se llenan antes de Museum/gallery exhibitions officer al beb para el quinto da despus del [place].  Se siente triste y deprimida.  El beb est demasiado somnoliento como para comer bien.  El beb tiene problemas para dormir.  Moja menos de 3 paales en 24 horas.  Defeca menos de 3 veces en 24 horas.  La piel del beb o la parte blanca de los ojos se vuelven amarillentas.  El beb no ha aumentado de [place] a los [address] de [place].  SOLICITE ATENCIN MDICA DE INMEDIATO SI:  El beb est muy cansado Retail buyer) y no se quiere despertar para comer.  Le sube la fiebre sin causa.  Esta informacin no tiene Theme park manager el consejo del mdico. Asegrese de hacerle al mdico cualquier pregunta que tenga. Document Released: 05/20/2005 Document Revised: 09/11/2015 Document Reviewed: 11/11/2012 Elsevier Interactive Patient Education  2017 [employer].  Informacin sobre la prueba de Jeff de parto despus de Evelyn Price cesrea (Trial of Labor After Cesarean Delivery Information) La prueba de [place] de parto  despus de Eustace Quail (TOLAC por sus siglas en ingls) es cuando una mujer trata de dar a luz por va vaginal despus de una cesrea anterior. La TOLAC puede ser Evelyn Price opcin segura y [place] segn la historia clnica y otros factores de [place]. Cuando TOLAC tiene xito  y es capaz de tener un parto vaginal, esto se llama un parto vaginal despus de Evelyn Price cesrea (PVDC).  CANDIDATAS PARA TOLAC Este procedimiento es posible para algunas mujeres que:  Hayan sido sometidas a uno o dos partos por cesrea en los que la incisin del tero fue horizontal (transversal baja).  Estn esperando gemelos y tuvieron una incisin transversal baja durante una cesrea.  No tienen una cicatriz uterina vertical (clsica).  No han tenido una ruptura en la pared del tero (ruptura uterina). El TOLAC tambin puede intentarse en mujeres que cumplen con los criterios apropiados:  Son menores de [address].  Son altas y tienen un ndice de masa corporal [facility]) inferior a 30.  . Fredderick Phenix cicatriz uterina desconocida.  Dan a luz en un centro equipado para realizar un parto por cesrea de emergencia. Este equipo debe ser capaz de Company secretary las posibles complicaciones, como una ruptura uterina.  Tienen asesoramiento completo United Stationers beneficios y riesgos del TOLAC.  Han comentado acerca de futuros planes de embarazo con su mdico.  Han planificado tener varios embarazos ms. CANDIDATAS A TENER MS XITO EN TOLAC:  Han tenido un parto vaginal exitoso antes o despus de su parto por cesrea.  Experimentan un trabajo de parto que comienza, naturalmente, en o antes de la fecha estimada (40 semanas de gestacin).  El beb no es muy grande ( macrosmico).   Han tenido un parto por cesrea anterior, pero actualmente no hay factores que promoveran un parto por cesrea (como una posicin de [place]).  Han tenido un solo parto por cesrea anteriormente.  Tuvo un parto por cesrea antes de realizar el [place] de parto y no despus de Teacher, English as a foreign language. TOLAC puede ser ms apropiado para mujeres que cumplen con las normas anteriores y que planean tener ms embarazos. No se recomienda en partos domiciliarios. CANDIDATAS A TENER MENOS XITO EN TOLAC:  Tienen un parto inducido con  un cuello uterino desfavorable. Un cuello uterino desfavorable es cuando no se dilata lo suficiente (entre otros factores).  Nunca han tenido un parto vaginal.  Han tenido ms de dos partos por cesrea.  Tienen un embarazo de ms de 40 semanas de gestacin.  Est embarazada de un un beb con sospecha de un peso mayor de 4.000 gramos (8  libras) y no tiene antecedentes de un parto vaginal.  Han tenido embarazos muy cercanos. BENEFICIOS SUGERIDOS DE LA TOLAC  Tiempo de recuperacin ms rpido.  Permanencia ms breve en el hospital.  Menos dolor y problemas que en un parto por cesrea. Las [employer] tienen un parto por cesrea tienen una mayor probabilidad de necesitar sangre o tener fiebre, una infeccin o un cogulo sanguneo en las piernas. RIESGOS SUGERIDOS DE LA TOLAC El riesgo ms alto de complicaciones ocurre en mujeres que intentan un TOLAC y fracasan. Una TOLAC que fracasa resulta en una cesrea no planificada. Los riesgos relacionados con TOLAC o cesreas repetidas son:   Lulu Riding.  Infeccin.  Cogulos sanguneos.  Lesiones en los rganos o tejidos circundantes.  Menor riesgo de remocin del tero (histerectoma).  Posibles problemas con la placenta (como placenta previa o placenta acreta) en embarazos futuros. Aunque es muy raro, las  preocupaciones principales con el TOLAC son:  Ruptura de la cicatriz uterina de una cesrea anterior.  Necesidad de una cesrea de Associate Professor.  Tener un mal resultado para el beb (morbilidad perinatal). Irven Shelling MS INFORMACIN [employer] of Obstetricians and Gynecologists (Colegio Estadounidense de Obstetras y Gineclogos): www.acog.org [employer] of Nurse-Midwives (Colegio Estadounidense de Enfermeras - parteras): www.midwife.org Esta informacin no tiene Theme park manager el consejo del mdico. Asegrese de hacerle al mdico cualquier pregunta que tenga. Document Released: 05/09/2011 Document Revised:  03/10/2013 Elsevier Interactive Patient Education  2017 [employer].

## 2017-04-16 ENCOUNTER — Telehealth: Payer: Self-pay | Admitting: *Deleted

## 2017-04-16 NOTE — Telephone Encounter (Signed)
Used Spanish interpreter [phone] to call patient. Message was left on voice mail stating I have scheduled her u/s at the health department for Dec 3 @ 1000.

## 2017-04-21 ENCOUNTER — Encounter: Payer: Self-pay | Admitting: *Deleted

## 2017-04-21 LAB — GLUCOSE, 1 HOUR: GLUCOSE, 1 HOUR-GESTATIONAL: 97

## 2017-04-21 LAB — T3: Triiodothyronine (T3): 3.2

## 2017-04-21 LAB — T4: Thyroxine (T4): 1.1

## 2017-04-21 LAB — HM PAP SMEAR: Pap Smear: NEGATIVE

## 2017-04-21 LAB — LEAD, BLOOD: LEAD, BLOOD: NEGATIVE

## 2017-04-21 LAB — TSH: TSH: 0.25

## 2017-04-21 LAB — CULTURE, OB URINE: URINE CULTURE, OB: NEGATIVE

## 2017-04-21 LAB — HEPATITIS C ANTIBODY: HEP C AB: NEGATIVE

## 2017-05-06 ENCOUNTER — Other Ambulatory Visit (HOSPITAL_COMMUNITY): Payer: Self-pay

## 2017-05-08 ENCOUNTER — Ambulatory Visit (INDEPENDENT_AMBULATORY_CARE_PROVIDER_SITE_OTHER): Payer: Self-pay | Admitting: Obstetrics and Gynecology

## 2017-05-08 VITALS — BP 125/80 | HR 74 | Wt 124.7 lb

## 2017-05-08 DIAGNOSIS — O99282 Endocrine, nutritional and metabolic diseases complicating pregnancy, second trimester: Secondary | ICD-10-CM

## 2017-05-08 DIAGNOSIS — O09522 Supervision of elderly multigravida, second trimester: Secondary | ICD-10-CM

## 2017-05-08 DIAGNOSIS — E0590 Thyrotoxicosis, unspecified without thyrotoxic crisis or storm: Secondary | ICD-10-CM

## 2017-05-08 DIAGNOSIS — O0990 Supervision of high risk pregnancy, unspecified, unspecified trimester: Secondary | ICD-10-CM

## 2017-05-08 DIAGNOSIS — O0992 Supervision of high risk pregnancy, unspecified, second trimester: Secondary | ICD-10-CM

## 2017-05-08 DIAGNOSIS — O34219 Maternal care for unspecified type scar from previous cesarean delivery: Secondary | ICD-10-CM

## 2017-05-08 NOTE — Progress Notes (Signed)
Scheduled anatomy ultrasound for 12/10 @ 1130 at [facility]

## 2017-05-08 NOTE — Progress Notes (Signed)
   PRENATAL VISIT NOTE  Subjective:  Evelyn Price is a 36 y.o. G2P1001 at 4744w6d being seen today for ongoing prenatal care.  She is currently monitored for the following issues for this high-risk pregnancy and has Hyperthyroidism; Supervision of high risk pregnancy, antepartum; AMA (advanced maternal age) multigravida 35+; Hyperthyroidism affecting pregnancy; and Previous cesarean section complicating pregnancy on their problem list.  Patient reports no complaints.  Contractions: Not present. Vag. Bleeding: None.  Movement: Present. Denies leaking of fluid.   The following portions of the patient's history were reviewed and updated as appropriate: allergies, current medications, past family history, past medical history, past social history, past surgical history and problem list. Problem list updated.  Objective:   Vitals:   05/08/17 1114  BP: 125/80  Pulse: 74    Fetal Status: Fetal Heart Rate (bpm): 154 Fundal Height: 20 cm Movement: Present     General:  Alert, oriented and cooperative. Patient is in no acute distress.  Skin: Skin is warm and dry. No rash noted.   Cardiovascular: Normal heart rate noted  Respiratory: Normal respiratory effort, no problems with respiration noted  Abdomen: Soft, gravid, appropriate for gestational age.  Pain/Pressure: Absent     Pelvic: Cervical exam deferred        Extremities: Normal range of motion.  Edema: None  Mental Status:  Normal mood and affect. Normal behavior. Normal judgment and thought content.   Assessment and Plan:  Pregnancy: G2P1001 at 9844w6d  1. Supervision of high risk pregnancy, antepartum Patient is doing well without complaints Anatomy ultrasound scheduled with health department Discussed increasing protein intake. Patient with 10lb weight loss since the onset of pregnancy  2. Previous cesarean section complicating pregnancy Patient is interested in TOLAC  3. Elderly multigravida in second  trimester Normal NIPS  4. Hyperthyroidism affecting pregnancy in second trimester Followed by endocrinologist, was seen last week and reports normal thyroid. Has follow-up in 3 months Currently not on any meds  General obstetric precautions including but not limited to vaginal bleeding, contractions, leaking of fluid and fetal movement were reviewed in detail with the patient. Please refer to After Visit Summary for other counseling recommendations.  Return in about 4 weeks (around 06/05/2017) for ROB.   Catalina AntiguaPeggy Magdala Brahmbhatt, MD

## 2017-06-03 NOTE — L&D Delivery Note (Addendum)
Delivery Note At 12:09 AM a viable and healthy female was delivered via Vaginal, Spontaneous (Presentation: cephalid; ROA  ). VBAC.  APGAR: 8, 9;  Weight: pending  Placenta status: spontaneous, intact .  Cord: 3 vessel  complications: tight nuchal x1, delivered by somersault Cord pH: pending  Anesthesia:  epidural Episiotomy: None Lacerations: Sulcus;Labial. Sulcus repaired via running subq, labial repaired with single simple interrupted Suture Repair: 3.0 vicryl Est. Blood Loss (mL):  150  Mom to postpartum.  Baby to Couplet care / Skin to Skin.  Myrene BuddyJacob Fletcher 09/24/2017, 12:49 AM  I was gloved, present and supervised delivery with repair in its entirety.   Sharyon CableVeronica C Meeya Goldin, CNM 09/24/17, 12:59 AM

## 2017-06-05 ENCOUNTER — Ambulatory Visit (INDEPENDENT_AMBULATORY_CARE_PROVIDER_SITE_OTHER): Payer: Self-pay | Admitting: Obstetrics and Gynecology

## 2017-06-05 VITALS — BP 130/76 | HR 81 | Wt 128.0 lb

## 2017-06-05 DIAGNOSIS — O09522 Supervision of elderly multigravida, second trimester: Secondary | ICD-10-CM

## 2017-06-05 DIAGNOSIS — O99282 Endocrine, nutritional and metabolic diseases complicating pregnancy, second trimester: Secondary | ICD-10-CM

## 2017-06-05 DIAGNOSIS — O34219 Maternal care for unspecified type scar from previous cesarean delivery: Secondary | ICD-10-CM

## 2017-06-05 DIAGNOSIS — O0990 Supervision of high risk pregnancy, unspecified, unspecified trimester: Secondary | ICD-10-CM

## 2017-06-05 DIAGNOSIS — E0590 Thyrotoxicosis, unspecified without thyrotoxic crisis or storm: Secondary | ICD-10-CM

## 2017-06-05 MED ORDER — FLUCONAZOLE 150 MG PO TABS: 150.0000 mg | ORAL_TABLET | Freq: Once | ORAL | 0 refills | Status: AC

## 2017-06-05 NOTE — Progress Notes (Signed)
   PRENATAL VISIT NOTE  Subjective:  Evelyn Price is a 37 y.o. G2P1001 at 6039w6d being seen today for ongoing prenatal care.  She is currently monitored for the following issues for this high-risk pregnancy and has Hyperthyroidism; Supervision of high risk pregnancy, antepartum; AMA (advanced maternal age) multigravida 35+; Hyperthyroidism affecting pregnancy; and Previous cesarean section complicating pregnancy on their problem list.  Patient reports some vaginal pruritis for the past 2 days.  Contractions: Not present. Vag. Bleeding: None.  Movement: Present. Denies leaking of fluid.   The following portions of the patient's history were reviewed and updated as appropriate: allergies, current medications, past family history, past medical history, past social history, past surgical history and problem list. Problem list updated.  Objective:   Vitals:   06/05/17 0912  BP: 130/76  Pulse: 81  Weight: 128 lb (58.1 kg)    Fetal Status: Fetal Heart Rate (bpm): 145 Fundal Height: 23 cm Movement: Present     General:  Alert, oriented and cooperative. Patient is in no acute distress.  Skin: Skin is warm and dry. No rash noted.   Cardiovascular: Normal heart rate noted  Respiratory: Normal respiratory effort, no problems with respiration noted  Abdomen: Soft, gravid, appropriate for gestational age.  Pain/Pressure: Present     Pelvic: Cervical exam deferred        Extremities: Normal range of motion.  Edema: None  Mental Status:  Normal mood and affect. Normal behavior. Normal judgment and thought content.   Assessment and Plan:  Pregnancy: G2P1001 at 3739w6d  1. Supervision of high risk pregnancy, antepartum Patient is doing well Rx for diflucan provided for presumed yeast infection Anatomy ultrasound results reviewed Third trimester labs next visit with glucola and tdap  2. Elderly multigravida in second trimester Normal NIPS  3. Previous cesarean section complicating  pregnancy   4. Hyperthyroidism affecting pregnancy in second trimester Has follow up with endocrinologist in February  Preterm labor symptoms and general obstetric precautions including but not limited to vaginal bleeding, contractions, leaking of fluid and fetal movement were reviewed in detail with the patient. Please refer to After Visit Summary for other counseling recommendations.  Return in about 4 weeks (around 07/03/2017) for ROB, 2 hr glucola next visit.   Catalina AntiguaPeggy Qamar Aughenbaugh, MD

## 2017-06-17 ENCOUNTER — Encounter: Payer: Self-pay | Admitting: *Deleted

## 2017-07-04 ENCOUNTER — Ambulatory Visit (INDEPENDENT_AMBULATORY_CARE_PROVIDER_SITE_OTHER): Payer: Self-pay | Admitting: Obstetrics and Gynecology

## 2017-07-04 ENCOUNTER — Encounter: Payer: Self-pay | Admitting: Obstetrics and Gynecology

## 2017-07-04 VITALS — BP 132/77 | HR 79 | Wt 129.0 lb

## 2017-07-04 DIAGNOSIS — O0990 Supervision of high risk pregnancy, unspecified, unspecified trimester: Secondary | ICD-10-CM

## 2017-07-04 DIAGNOSIS — Z789 Other specified health status: Secondary | ICD-10-CM | POA: Insufficient documentation

## 2017-07-04 DIAGNOSIS — O99283 Endocrine, nutritional and metabolic diseases complicating pregnancy, third trimester: Secondary | ICD-10-CM

## 2017-07-04 DIAGNOSIS — Z23 Encounter for immunization: Secondary | ICD-10-CM

## 2017-07-04 DIAGNOSIS — O34219 Maternal care for unspecified type scar from previous cesarean delivery: Secondary | ICD-10-CM

## 2017-07-04 DIAGNOSIS — O09523 Supervision of elderly multigravida, third trimester: Secondary | ICD-10-CM

## 2017-07-04 DIAGNOSIS — E0590 Thyrotoxicosis, unspecified without thyrotoxic crisis or storm: Secondary | ICD-10-CM

## 2017-07-04 DIAGNOSIS — O0993 Supervision of high risk pregnancy, unspecified, third trimester: Secondary | ICD-10-CM

## 2017-07-04 LAB — POCT URINALYSIS DIP (DEVICE)
BILIRUBIN URINE: NEGATIVE
GLUCOSE, UA: NEGATIVE mg/dL
HGB URINE DIPSTICK: NEGATIVE
Ketones, ur: NEGATIVE mg/dL
LEUKOCYTES UA: NEGATIVE
NITRITE: NEGATIVE
Protein, ur: NEGATIVE mg/dL
Specific Gravity, Urine: 1.015 (ref 1.005–1.030)
Urobilinogen, UA: 0.2 mg/dL (ref 0.0–1.0)
pH: 7 (ref 5.0–8.0)

## 2017-07-04 NOTE — Progress Notes (Signed)
- 

## 2017-07-04 NOTE — Progress Notes (Signed)
Prenatal Visit Note Date: 07/04/2017 Clinic: [facility]  Subjective:  Evelyn Price is a 37 y.o. G2P1001 at 7975w0d being seen today for ongoing prenatal care.  She is currently monitored for the following issues for this high-risk pregnancy and has Hyperthyroidism; Supervision of high risk pregnancy, antepartum; AMA (advanced maternal age) multigravida 35+; Hyperthyroidism affecting pregnancy; Previous cesarean section complicating pregnancy; and Language barrier on their problem list.  Patient reports no complaints.   Contractions: Irritability. Vag. Bleeding: None.  Movement: Present. Denies leaking of fluid.   The following portions of the patient's history were reviewed and updated as appropriate: allergies, current medications, past family history, past medical history, past social history, past surgical history and problem list. Problem list updated.  Objective:   Vitals:   07/04/17 0825  BP: 132/77  Pulse: 79  Weight: 129 lb (58.5 kg)    Fetal Status: Fetal Heart Rate (bpm): 142 Fundal Height: 28 cm Movement: Present     General:  Alert, oriented and cooperative. Patient is in no acute distress.  Skin: Skin is warm and dry. No rash noted.   Cardiovascular: Normal heart rate noted  Respiratory: Normal respiratory effort, no problems with respiration noted  Abdomen: Soft, gravid, appropriate for gestational age. Pain/Pressure: Absent     Pelvic:  Cervical exam deferred        Extremities: Normal range of motion.  Edema: None  Mental Status: Normal mood and affect. Normal behavior. Normal judgment and thought content.   Urinalysis:      Assessment and Plan:  Pregnancy: G2P1001 at 3875w0d  1. Supervision of high risk pregnancy in third trimester Routine care. OCPs - T4, free - CBC - RPR - HIV antibody (with reflex) - Glucose Tolerance, 2 Hours w/1 Hour  2. Language barrier Interpreter used  3. Hyperthyroidism affecting pregnancy in  third trimester Not on meds. States is seen by Dr. Elbert EwingsMekala. Has f/u in march but levels have been fine. Will check ft4 today  4. Supervision of high risk pregnancy, antepartum  5. Elderly multigravida in third trimester No issues  6. Previous cesarean section complicating pregnancy Desires tolac. vbac consent given to her and can talk to her more later in pregnancy. Looks like she had 2008 pLTCS for failure to descend; pt states was 7lbs 8oz. Consider 37-38wk growth u/s.   Preterm labor symptoms and general obstetric precautions including but not limited to vaginal bleeding, contractions, leaking of fluid and fetal movement were reviewed in detail with the patient. Please refer to After Visit Summary for other counseling recommendations.  Return in about 2 weeks (around 07/18/2017) for rob.   [place] BingPickens, Kathren Scearce, MD

## 2017-07-04 NOTE — Addendum Note (Signed)
Addended by: Garret ReddishBARNES, Juni Glaab M on: 07/04/2017 11:17 AM   Modules accepted: Orders

## 2017-07-04 NOTE — Addendum Note (Signed)
Addended by: Garret ReddishBARNES, Ashliegh Parekh M on: 07/04/2017 09:47 AM   Modules accepted: Orders

## 2017-07-05 LAB — CBC
Hematocrit: 32.9 % — ABNORMAL LOW (ref 34.0–46.6)
Hemoglobin: 10.9 g/dL — ABNORMAL LOW (ref 11.1–15.9)
MCH: 31.5 pg (ref 26.6–33.0)
MCHC: 33.1 g/dL (ref 31.5–35.7)
MCV: 95 fL (ref 79–97)
Platelets: 336 10*3/uL (ref 150–379)
RBC: 3.46 x10E6/uL — ABNORMAL LOW (ref 3.77–5.28)
RDW: 14.6 % (ref 12.3–15.4)
WBC: 12.2 10*3/uL — ABNORMAL HIGH (ref 3.4–10.8)

## 2017-07-05 LAB — T4, FREE: Free T4: 1.06 ng/dL (ref 0.82–1.77)

## 2017-07-05 LAB — SYPHILIS: RPR W/REFLEX TO RPR TITER AND TREPONEMAL ANTIBODIES, TRADITIONAL SCREENING AND DIAGNOSIS ALGORITHM: RPR Ser Ql: NONREACTIVE

## 2017-07-05 LAB — HIV ANTIBODY (ROUTINE TESTING W REFLEX): HIV Screen 4th Generation wRfx: NONREACTIVE

## 2017-07-08 ENCOUNTER — Other Ambulatory Visit: Payer: Self-pay

## 2017-07-08 DIAGNOSIS — O0993 Supervision of high risk pregnancy, unspecified, third trimester: Secondary | ICD-10-CM

## 2017-07-10 LAB — GLUCOSE TOLERANCE, 2 HOURS W/ 1HR
GLUCOSE, 1 HOUR: 94 mg/dL (ref 65–179)
GLUCOSE, 2 HOUR: 97 mg/dL (ref 65–152)
Glucose, Fasting: 72 mg/dL (ref 65–91)

## 2017-07-23 ENCOUNTER — Ambulatory Visit (INDEPENDENT_AMBULATORY_CARE_PROVIDER_SITE_OTHER): Payer: Self-pay | Admitting: Obstetrics and Gynecology

## 2017-07-23 ENCOUNTER — Encounter: Payer: Self-pay | Admitting: Obstetrics and Gynecology

## 2017-07-23 VITALS — BP 132/73 | HR 84 | Wt 136.2 lb

## 2017-07-23 DIAGNOSIS — O34219 Maternal care for unspecified type scar from previous cesarean delivery: Secondary | ICD-10-CM

## 2017-07-23 DIAGNOSIS — Z789 Other specified health status: Secondary | ICD-10-CM

## 2017-07-23 DIAGNOSIS — O0990 Supervision of high risk pregnancy, unspecified, unspecified trimester: Secondary | ICD-10-CM

## 2017-07-23 DIAGNOSIS — E0590 Thyrotoxicosis, unspecified without thyrotoxic crisis or storm: Secondary | ICD-10-CM

## 2017-07-23 DIAGNOSIS — O99283 Endocrine, nutritional and metabolic diseases complicating pregnancy, third trimester: Secondary | ICD-10-CM

## 2017-07-23 DIAGNOSIS — O09523 Supervision of elderly multigravida, third trimester: Secondary | ICD-10-CM

## 2017-07-23 NOTE — Progress Notes (Signed)
error 

## 2017-07-23 NOTE — Progress Notes (Signed)
-    PRENATAL VISIT NOTE  Subjective:  Evelyn Price is a 37 y.o. G2P1001 at 2529w5d being seen today for ongoing prenatal care.  She is currently monitored for the following issues for this high-risk pregnancy and has Hyperthyroidism; Supervision of high risk pregnancy, antepartum; AMA (advanced maternal age) multigravida 35+; Hyperthyroidism affecting pregnancy; Previous cesarean section complicating pregnancy; and Language barrier on their problem list.  Patient reports some nausea but feels she has been eating better this month.  Contractions: Irritability. Vag. Bleeding: None.  Movement: Present. Denies leaking of fluid.   The following portions of the patient's history were reviewed and updated as appropriate: allergies, current medications, past family history, past medical history, past social history, past surgical history and problem list. Problem list updated.  Objective:   Vitals:   07/23/17 1408  BP: 132/73  Pulse: 84  Weight: 136 lb 3.2 oz (61.8 kg)    Fetal Status: Fetal Heart Rate (bpm): 144 Fundal Height: 29 cm Movement: Present     General:  Alert, oriented and cooperative. Patient is in no acute distress.  Skin: Skin is warm and dry. No rash noted.   Cardiovascular: Normal heart rate noted  Respiratory: Normal respiratory effort, no problems with respiration noted  Abdomen: Soft, gravid, appropriate for gestational age.  Pain/Pressure: Present     Pelvic: Cervical exam deferred        Extremities: Normal range of motion.  Edema: None  Mental Status:  Normal mood and affect. Normal behavior. Normal judgment and thought content.   Assessment and Plan:  Pregnancy: G2P1001 at 8429w5d  1. Hyperthyroidism affecting pregnancy in third trimester No meds, seen by Dr. Elbert EwingsMekala Last fT4 was normal F/u in March  2. Supervision of high risk pregnancy, antepartum  3. Elderly multigravida in third trimester  4. Previous cesarean section complicating  pregnancy Reviewed risks/benefits of TOLAC versus RCS in detail. Patient counseled regarding potential vaginal delivery, chance of success, future implications, possible uterine rupture and need for urgent/emergent repeat cesarean. Counseled regarding potential need for repeat c-section for reasons unrelated to first c-section. Counseled regarding scheduled repeat cesarean including risks of bleeding, infection, damage to surrounding tissue, abnormal placentation, implications for future pregnancies. All questions answered.  Patient desires TOLAC, consent signed 07/23/2017.  5. Language barrier Spanish translator used  Preterm labor symptoms and general obstetric precautions including but not limited to vaginal bleeding, contractions, leaking of fluid and fetal movement were reviewed in detail with the patient. Please refer to After Visit Summary for other counseling recommendations.  Return in about 2 weeks (around 08/06/2017) for OB visit (MD).   Conan BowensKelly M Davis, MD

## 2017-07-25 ENCOUNTER — Encounter: Payer: Self-pay | Admitting: *Deleted

## 2017-08-06 ENCOUNTER — Encounter: Payer: Self-pay | Admitting: Obstetrics & Gynecology

## 2017-08-06 ENCOUNTER — Ambulatory Visit (INDEPENDENT_AMBULATORY_CARE_PROVIDER_SITE_OTHER): Payer: Self-pay | Admitting: Obstetrics & Gynecology

## 2017-08-06 VITALS — BP 126/77 | HR 79 | Wt 142.6 lb

## 2017-08-06 DIAGNOSIS — E0590 Thyrotoxicosis, unspecified without thyrotoxic crisis or storm: Secondary | ICD-10-CM

## 2017-08-06 DIAGNOSIS — O0993 Supervision of high risk pregnancy, unspecified, third trimester: Secondary | ICD-10-CM

## 2017-08-06 DIAGNOSIS — O99283 Endocrine, nutritional and metabolic diseases complicating pregnancy, third trimester: Secondary | ICD-10-CM

## 2017-08-06 DIAGNOSIS — O0990 Supervision of high risk pregnancy, unspecified, unspecified trimester: Secondary | ICD-10-CM

## 2017-08-06 NOTE — Patient Instructions (Signed)

## 2017-08-06 NOTE — Progress Notes (Signed)
   PRENATAL VISIT NOTE  Subjective:  Evelyn Price is a 37 y.o. G2P1001 at 4129w5d being seen today for ongoing prenatal care.  She is currently monitored for the following issues for this high-risk pregnancy and has Hyperthyroidism; Supervision of high risk pregnancy, antepartum; AMA (advanced maternal age) multigravida 35+; Hyperthyroidism affecting pregnancy; Previous cesarean section complicating pregnancy; and Language barrier on their problem list.  Patient reports no complaints.  Contractions: Irritability. Vag. Bleeding: None.  Movement: Present. Denies leaking of fluid.   The following portions of the patient's history were reviewed and updated as appropriate: allergies, current medications, past family history, past medical history, past social history, past surgical history and problem list. Problem list updated.  Objective:   Vitals:   08/06/17 1318  BP: 126/77  Pulse: 79  Weight: 142 lb 9.6 oz (64.7 kg)    Fetal Status: Fetal Heart Rate (bpm): 155   Movement: Present     General:  Alert, oriented and cooperative. Patient is in no acute distress.  Skin: Skin is warm and dry. No rash noted.   Cardiovascular: Normal heart rate noted  Respiratory: Normal respiratory effort, no problems with respiration noted  Abdomen: Soft, gravid, appropriate for gestational age.  Pain/Pressure: Present     Pelvic: Cervical exam deferred        Extremities: Normal range of motion.  Edema: None  Mental Status:  Normal mood and affect. Normal behavior. Normal judgment and thought content.   Assessment and Plan:  Pregnancy: G2P1001 at 7629w5d  1. Supervision of high risk pregnancy, antepartum   2. Hyperthyroidism affecting pregnancy in third trimester Saw endocrinologist 3/5  Preterm labor symptoms and general obstetric precautions including but not limited to vaginal bleeding, contractions, leaking of fluid and fetal movement were reviewed in detail with the patient. Please  refer to After Visit Summary for other counseling recommendations.  Return in about 2 weeks (around 08/20/2017).   Scheryl DarterJames Arnold, MD

## 2017-08-08 ENCOUNTER — Telehealth: Payer: Self-pay

## 2017-08-08 NOTE — Telephone Encounter (Signed)
Pt called concerned of bleeding only when she wipes after using the bathroom. Pt was already advised earlier,as long as baby moves consecutive times than every thing is ok. Advised pt to go to [facility] if bleeding starts to flow.Pt acknowledged understanding.

## 2017-08-18 ENCOUNTER — Ambulatory Visit (INDEPENDENT_AMBULATORY_CARE_PROVIDER_SITE_OTHER): Payer: Self-pay | Admitting: Obstetrics and Gynecology

## 2017-08-18 ENCOUNTER — Encounter: Payer: Self-pay | Admitting: Obstetrics and Gynecology

## 2017-08-18 VITALS — BP 126/80 | HR 75 | Wt 144.7 lb

## 2017-08-18 DIAGNOSIS — O0990 Supervision of high risk pregnancy, unspecified, unspecified trimester: Secondary | ICD-10-CM

## 2017-08-18 DIAGNOSIS — O99283 Endocrine, nutritional and metabolic diseases complicating pregnancy, third trimester: Secondary | ICD-10-CM

## 2017-08-18 DIAGNOSIS — Z789 Other specified health status: Secondary | ICD-10-CM

## 2017-08-18 DIAGNOSIS — E0590 Thyrotoxicosis, unspecified without thyrotoxic crisis or storm: Secondary | ICD-10-CM

## 2017-08-18 DIAGNOSIS — O09523 Supervision of elderly multigravida, third trimester: Secondary | ICD-10-CM

## 2017-08-18 DIAGNOSIS — O0993 Supervision of high risk pregnancy, unspecified, third trimester: Secondary | ICD-10-CM

## 2017-08-18 DIAGNOSIS — O34219 Maternal care for unspecified type scar from previous cesarean delivery: Secondary | ICD-10-CM

## 2017-08-18 NOTE — Progress Notes (Signed)
   PRENATAL VISIT NOTE  Subjective:  Evelyn Price is a 37 y.o. G2P1001 at 4026w3d being seen today for ongoing prenatal care.  She is currently monitored for the following issues for this high-risk pregnancy and has Hyperthyroidism; Supervision of high risk pregnancy, antepartum; AMA (advanced maternal age) multigravida 35+; Hyperthyroidism affecting pregnancy; Previous cesarean section complicating pregnancy; and Language barrier on their problem list.  Patient reports pelvic pressure, some difficulty sleeping.  Contractions: Irritability. Vag. Bleeding: Bloody Show.  Movement: Present. Denies leaking of fluid. Had some minor bleeding a few weeks ago that did not continue.   The following portions of the patient's history were reviewed and updated as appropriate: allergies, current medications, past family history, past medical history, past social history, past surgical history and problem list. Problem list updated.  Objective:   Vitals:   08/18/17 1350  BP: 126/80  Pulse: 75  Weight: 144 lb 11.2 oz (65.6 kg)    Fetal Status: Fetal Heart Rate (bpm): 132 Fundal Height: 33 cm Movement: Present     General:  Alert, oriented and cooperative. Patient is in no acute distress.  Skin: Skin is warm and dry. No rash noted.   Cardiovascular: Normal heart rate noted  Respiratory: Normal respiratory effort, no problems with respiration noted  Abdomen: Soft, gravid, appropriate for gestational age.  Pain/Pressure: Present     Pelvic: Cervical exam deferred        Extremities: Normal range of motion.  Edema: None  Mental Status:  Normal mood and affect. Normal behavior. Normal judgment and thought content.   Assessment and Plan:  Pregnancy: G2P1001 at 7426w3d  1. Hyperthyroidism affecting pregnancy in third trimester Last labs normal 07/2017 Growth US ordered 3/18 Saw endocrine last week, was not told about any issues, she will call to follow up with Endocrine office  2.  Supervision of high risk pregnancy, antepartum  3. Previous cesarean section complicating pregnancy For TOLAC  4. Elderly multigravida in third trimester  5. Language barrier Engineer, structuralpanish translator used   Preterm labor symptoms and general obstetric precautions including but not limited to vaginal bleeding, contractions, leaking of fluid and fetal movement were reviewed in detail with the patient. Please refer to After Visit Summary for other counseling recommendations.  Return in about 2 weeks (around 09/01/2017) for OB visit (MD).   Conan BowensKelly M Davis, MD

## 2017-08-18 NOTE — Progress Notes (Signed)
Spanish Interpreter Mariel Ronie SpiesGallego  Educated pt on Breastfeeding for 1st six month

## 2017-08-20 ENCOUNTER — Other Ambulatory Visit: Payer: Self-pay | Admitting: Obstetrics and Gynecology

## 2017-08-20 ENCOUNTER — Ambulatory Visit (HOSPITAL_COMMUNITY)
Admission: RE | Admit: 2017-08-20 | Discharge: 2017-08-20 | Disposition: A | Discharge status: Home / Self Care | Payer: Self-pay | Source: Ambulatory Visit | Attending: Obstetrics and Gynecology | Admitting: Obstetrics and Gynecology

## 2017-08-20 ENCOUNTER — Encounter (HOSPITAL_COMMUNITY): Payer: Self-pay

## 2017-08-20 ENCOUNTER — Other Ambulatory Visit (HOSPITAL_COMMUNITY): Payer: Self-pay | Admitting: *Deleted

## 2017-08-20 DIAGNOSIS — E0590 Thyrotoxicosis, unspecified without thyrotoxic crisis or storm: Secondary | ICD-10-CM

## 2017-08-20 DIAGNOSIS — O0990 Supervision of high risk pregnancy, unspecified, unspecified trimester: Secondary | ICD-10-CM

## 2017-08-20 DIAGNOSIS — O09523 Supervision of elderly multigravida, third trimester: Secondary | ICD-10-CM

## 2017-08-20 DIAGNOSIS — Z3A34 34 weeks gestation of pregnancy: Secondary | ICD-10-CM

## 2017-08-20 DIAGNOSIS — O99283 Endocrine, nutritional and metabolic diseases complicating pregnancy, third trimester: Principal | ICD-10-CM

## 2017-08-20 DIAGNOSIS — O34219 Maternal care for unspecified type scar from previous cesarean delivery: Secondary | ICD-10-CM

## 2017-08-20 DIAGNOSIS — Z363 Encounter for antenatal screening for malformations: Secondary | ICD-10-CM | POA: Insufficient documentation

## 2017-09-03 ENCOUNTER — Ambulatory Visit (INDEPENDENT_AMBULATORY_CARE_PROVIDER_SITE_OTHER): Payer: Self-pay | Admitting: Obstetrics and Gynecology

## 2017-09-03 VITALS — BP 124/83 | HR 75 | Wt 146.0 lb

## 2017-09-03 DIAGNOSIS — O09523 Supervision of elderly multigravida, third trimester: Secondary | ICD-10-CM

## 2017-09-03 DIAGNOSIS — Z113 Encounter for screening for infections with a predominantly sexual mode of transmission: Secondary | ICD-10-CM

## 2017-09-03 DIAGNOSIS — E0590 Thyrotoxicosis, unspecified without thyrotoxic crisis or storm: Secondary | ICD-10-CM

## 2017-09-03 DIAGNOSIS — O99283 Endocrine, nutritional and metabolic diseases complicating pregnancy, third trimester: Secondary | ICD-10-CM

## 2017-09-03 DIAGNOSIS — Z789 Other specified health status: Secondary | ICD-10-CM

## 2017-09-03 DIAGNOSIS — Z3689 Encounter for other specified antenatal screening: Secondary | ICD-10-CM

## 2017-09-03 DIAGNOSIS — O34219 Maternal care for unspecified type scar from previous cesarean delivery: Secondary | ICD-10-CM

## 2017-09-03 DIAGNOSIS — O0990 Supervision of high risk pregnancy, unspecified, unspecified trimester: Secondary | ICD-10-CM

## 2017-09-03 LAB — OB RESULTS CONSOLE GBS: GBS: POSITIVE

## 2017-09-03 LAB — OB RESULTS CONSOLE GC/CHLAMYDIA: Gonorrhea: NEGATIVE

## 2017-09-03 NOTE — Patient Instructions (Signed)
Braxton Hicks Contractions °Contractions of the uterus can occur throughout pregnancy, but they are not always a sign that you are in labor. You may have practice contractions called Braxton Hicks contractions. These false labor contractions are sometimes confused with true labor. °What are Braxton Hicks contractions? °Braxton Hicks contractions are tightening movements that occur in the muscles of the uterus before labor. Unlike true labor contractions, these contractions do not result in opening (dilation) and thinning of the cervix. Toward the end of pregnancy (32-34 weeks), Braxton Hicks contractions can happen more often and may become stronger. These contractions are sometimes difficult to tell apart from true labor because they can be very uncomfortable. You should not feel embarrassed if you go to the hospital with false labor. °Sometimes, the only way to tell if you are in true labor is for your health care provider to look for changes in the cervix. The health care provider will do a physical exam and may monitor your contractions. If you are not in true labor, the exam should show that your cervix is not dilating and your water has not broken. °If there are other health problems associated with your pregnancy, it is completely safe for you to be sent home with false labor. You may continue to have Braxton Hicks contractions until you go into true labor. °How to tell the difference between true labor and false labor °True labor °· Contractions last 30-70 seconds. °· Contractions become very regular. °· Discomfort is usually felt in the top of the uterus, and it spreads to the lower abdomen and low back. °· Contractions do not go away with walking. °· Contractions usually become more intense and increase in frequency. °· The cervix dilates and gets thinner. °False labor °· Contractions are usually shorter and not as strong as true labor contractions. °· Contractions are usually irregular. °· Contractions  are often felt in the front of the lower abdomen and in the groin. °· Contractions may go away when you walk around or change positions while lying down. °· Contractions get weaker and are shorter-lasting as time goes on. °· The cervix usually does not dilate or become thin. °Follow these instructions at home: °· Take over-the-counter and prescription medicines only as told by your health care provider. °· Keep up with your usual exercises and follow other instructions from your health care provider. °· Eat and drink lightly if you think you are going into labor. °· If Braxton Hicks contractions are making you uncomfortable: °? Change your position from lying down or resting to walking, or change from walking to resting. °? Sit and rest in a tub of warm water. °? Drink enough fluid to keep your urine pale yellow. Dehydration may cause these contractions. °? Do slow and deep breathing several times an hour. °· Keep all follow-up prenatal visits as told by your health care provider. This is important. °Contact a health care provider if: °· You have a fever. °· You have continuous pain in your abdomen. °Get help right away if: °· Your contractions become stronger, more regular, and closer together. °· You have fluid leaking or gushing from your vagina. °· You pass blood-tinged mucus (bloody show). °· You have bleeding from your vagina. °· You have low back pain that you never had before. °· You feel your baby’s head pushing down and causing pelvic pressure. °· Your baby is not moving inside you as much as it used to. °Summary °· Contractions that occur before labor are called Braxton   Hicks contractions, false labor, or practice contractions. °· Braxton Hicks contractions are usually shorter, weaker, farther apart, and less regular than true labor contractions. True labor contractions usually become progressively stronger and regular and they become more frequent. °· Manage discomfort from Braxton Hicks contractions by  changing position, resting in a warm bath, drinking plenty of water, or practicing deep breathing. °This information is not intended to replace advice given to you by your health care provider. Make sure you discuss any questions you have with your health care provider. °Document Released: 10/03/2016 Document Revised: 10/03/2016 Document Reviewed: 10/03/2016 °Elsevier Interactive Patient Education © 2018 [employer] ° °

## 2017-09-03 NOTE — Progress Notes (Signed)
   PRENATAL VISIT NOTE  Subjective:  Evelyn Price is a 37 y.o. G2P1001 at 5953w5d being seen today for ongoing prenatal care.  She is currently monitored for the following issues for this high-risk pregnancy and has Hyperthyroidism; Supervision of high risk pregnancy, antepartum; AMA (advanced maternal age) multigravida 35+; Hyperthyroidism affecting pregnancy; Previous cesarean section complicating pregnancy; and Language barrier on their problem list.  Patient reports a single lesion on her clitoris for the past two weeks without pain, pruitus, vaginal discharge or urinary symptoms. .  Contractions: Irritability. Vag. Bleeding: None.  Movement: Present. Denies leaking of fluid.   The following portions of the patient's history were reviewed and updated as appropriate: allergies, current medications, past family history, past medical history, past social history, past surgical history and problem list. Problem list updated.  Objective:   Vitals:   09/03/17 1543  BP: 124/83  Pulse: 75  Weight: 146 lb (66.2 kg)    Fetal Status: Fetal Heart Rate (bpm): 143   Movement: Present     General:  Alert, oriented and cooperative. Patient is in no acute distress.  Skin: Skin is warm and dry. No rash noted.   Cardiovascular: Normal heart rate noted  Respiratory: Normal respiratory effort, no problems with respiration noted  Abdomen: Soft, gravid, appropriate for gestational age.  Pain/Pressure: Present     Pelvic: Cervical exam deferred,         Extremities: Normal range of motion.  Edema: Trace  Mental Status: Normal mood and affect. Normal behavior. Normal judgment and thought content.   Assessment and Plan:  Pregnancy: G2P1001 at 6153w5d  1. Supervision of high risk pregnancy, antepartum -Reassured patient that lesion was most likely a benign wart, otherwise she is doing well -Group B strep and G/C collected today  2. Hyperthyroidism affecting pregnancy in third  trimester -follows with endocrinology -not on medication, last labs normal  3. Multigravida of advanced maternal age in third trimester -panorama normal -IOL at 7140wks  4. Previous cesarean section complicating pregnancy TOLAC  VBAC consent forms signed  5. Language barrier Interpreter used   Term labor symptoms and general obstetric precautions including but not limited to vaginal bleeding, contractions, leaking of fluid and fetal movement were reviewed in detail with the patient. Please refer to After Visit Summary for other counseling recommendations.  No follow-ups on file.  Future Appointments  Date Time Provider Department Center  09/17/2017  1:30 PM [facility] US 1 [facility]    Ryanne Hattie Pine, Medical Student

## 2017-09-04 LAB — GC/CHLAMYDIA PROBE AMP (~~LOC~~) NOT AT ARMC
Chlamydia: NEGATIVE
Neisseria Gonorrhea: NEGATIVE

## 2017-09-06 LAB — CULTURE, BETA STREP (GROUP B ONLY): STREP GP B CULTURE: POSITIVE — AB

## 2017-09-09 ENCOUNTER — Encounter: Payer: Self-pay | Admitting: Obstetrics and Gynecology

## 2017-09-09 DIAGNOSIS — O99820 Streptococcus B carrier state complicating pregnancy: Secondary | ICD-10-CM | POA: Insufficient documentation

## 2017-09-10 ENCOUNTER — Ambulatory Visit (INDEPENDENT_AMBULATORY_CARE_PROVIDER_SITE_OTHER): Payer: Self-pay | Admitting: Obstetrics & Gynecology

## 2017-09-10 VITALS — BP 123/84 | HR 73 | Wt 149.4 lb

## 2017-09-10 DIAGNOSIS — O99820 Streptococcus B carrier state complicating pregnancy: Secondary | ICD-10-CM

## 2017-09-10 DIAGNOSIS — O0990 Supervision of high risk pregnancy, unspecified, unspecified trimester: Secondary | ICD-10-CM

## 2017-09-10 DIAGNOSIS — O09523 Supervision of elderly multigravida, third trimester: Secondary | ICD-10-CM

## 2017-09-10 DIAGNOSIS — O34219 Maternal care for unspecified type scar from previous cesarean delivery: Secondary | ICD-10-CM

## 2017-09-10 DIAGNOSIS — Z789 Other specified health status: Secondary | ICD-10-CM

## 2017-09-10 NOTE — Progress Notes (Signed)
   PRENATAL VISIT NOTE  Subjective:  Evelyn Price is a 37 y.o. G2P1001 at 134w5d being seen today for ongoing prenatal care.  She is currently monitored for the following issues for this high-risk pregnancy and has Hyperthyroidism; Supervision of high risk pregnancy, antepartum; AMA (advanced maternal age) multigravida 35+; Hyperthyroidism affecting pregnancy; Previous cesarean section complicating pregnancy; Language barrier; and Group B Streptococcus carrier, antepartum on their problem list.  Patient reports no complaints.  Contractions: Irritability. Vag. Bleeding: None.  Movement: Present. Denies leaking of fluid.   The following portions of the patient's history were reviewed and updated as appropriate: allergies, current medications, past family history, past medical history, past social history, past surgical history and problem list. Problem list updated.  Objective:   Vitals:   09/10/17 1137  BP: 123/84  Pulse: 73  Weight: 149 lb 6.4 oz (67.8 kg)    Fetal Status: Fetal Heart Rate (bpm): 135   Movement: Present     General:  Alert, oriented and cooperative. Patient is in no acute distress.  Skin: Skin is warm and dry. No rash noted.   Cardiovascular: Normal heart rate noted  Respiratory: Normal respiratory effort, no problems with respiration noted  Abdomen: Soft, gravid, appropriate for gestational age.  Pain/Pressure: Present     Pelvic: Cervical exam deferred        Extremities: Normal range of motion.  Edema: Trace  Mental Status: Normal mood and affect. Normal behavior. Normal judgment and thought content.   Assessment and Plan:  Pregnancy: G2P1001 at 3734w5d  1. Multigravida of advanced maternal age in third trimester   2. Previous cesarean section complicating pregnancy   3. Supervision of high risk pregnancy, antepartum   4. Language barrier   5. Group B Streptococcus carrier, antepartum   Preterm labor symptoms and general obstetric  precautions including but not limited to vaginal bleeding, contractions, leaking of fluid and fetal movement were reviewed in detail with the patient. Please refer to After Visit Summary for other counseling recommendations.  No follow-ups on file.  Future Appointments  Date Time Provider Department Center  09/17/2017  1:30 PM [facility] US 1 [facility]    Allie BossierMyra C Aneesa Romey, MD

## 2017-09-17 ENCOUNTER — Encounter (HOSPITAL_COMMUNITY): Payer: Self-pay

## 2017-09-17 ENCOUNTER — Ambulatory Visit (HOSPITAL_COMMUNITY)
Admission: RE | Admit: 2017-09-17 | Discharge: 2017-09-17 | Disposition: A | Discharge status: Home / Self Care | Payer: Self-pay | Source: Ambulatory Visit | Attending: Obstetrics and Gynecology | Admitting: Obstetrics and Gynecology

## 2017-09-17 ENCOUNTER — Other Ambulatory Visit (HOSPITAL_COMMUNITY): Payer: Self-pay | Admitting: Obstetrics and Gynecology

## 2017-09-17 DIAGNOSIS — O0990 Supervision of high risk pregnancy, unspecified, unspecified trimester: Secondary | ICD-10-CM

## 2017-09-17 DIAGNOSIS — E0590 Thyrotoxicosis, unspecified without thyrotoxic crisis or storm: Secondary | ICD-10-CM

## 2017-09-17 DIAGNOSIS — O99820 Streptococcus B carrier state complicating pregnancy: Secondary | ICD-10-CM

## 2017-09-17 DIAGNOSIS — O99283 Endocrine, nutritional and metabolic diseases complicating pregnancy, third trimester: Secondary | ICD-10-CM | POA: Insufficient documentation

## 2017-09-17 DIAGNOSIS — O34219 Maternal care for unspecified type scar from previous cesarean delivery: Secondary | ICD-10-CM

## 2017-09-17 DIAGNOSIS — O09523 Supervision of elderly multigravida, third trimester: Secondary | ICD-10-CM

## 2017-09-17 DIAGNOSIS — Z3A38 38 weeks gestation of pregnancy: Secondary | ICD-10-CM

## 2017-09-17 NOTE — ED Notes (Signed)
Eda Royal present as interpreter. 

## 2017-09-22 ENCOUNTER — Telehealth: Payer: Self-pay | Admitting: General Practice

## 2017-09-22 NOTE — Telephone Encounter (Signed)
Called patient to reschedule.  Left message on VM for patient to give our office a call back in regards to appt.

## 2017-09-23 ENCOUNTER — Inpatient Hospital Stay (HOSPITAL_COMMUNITY)
Admission: AD | Admit: 2017-09-23 | Discharge: 2017-09-26 | DRG: 807 | Disposition: A | Discharge status: Home / Self Care | Payer: Medicaid Other | Source: Ambulatory Visit | Attending: Obstetrics and Gynecology | Admitting: Obstetrics and Gynecology

## 2017-09-23 ENCOUNTER — Inpatient Hospital Stay (HOSPITAL_COMMUNITY): Payer: Medicaid Other | Admitting: Anesthesiology

## 2017-09-23 ENCOUNTER — Encounter: Payer: Self-pay | Admitting: Obstetrics & Gynecology

## 2017-09-23 ENCOUNTER — Encounter (HOSPITAL_COMMUNITY): Payer: Self-pay

## 2017-09-23 ENCOUNTER — Ambulatory Visit (INDEPENDENT_AMBULATORY_CARE_PROVIDER_SITE_OTHER): Payer: Self-pay | Admitting: Obstetrics & Gynecology

## 2017-09-23 VITALS — BP 144/95 | HR 75 | Wt 152.3 lb

## 2017-09-23 DIAGNOSIS — E0590 Thyrotoxicosis, unspecified without thyrotoxic crisis or storm: Secondary | ICD-10-CM | POA: Diagnosis present

## 2017-09-23 DIAGNOSIS — Z3A39 39 weeks gestation of pregnancy: Secondary | ICD-10-CM | POA: Diagnosis not present

## 2017-09-23 DIAGNOSIS — O34219 Maternal care for unspecified type scar from previous cesarean delivery: Secondary | ICD-10-CM | POA: Diagnosis present

## 2017-09-23 DIAGNOSIS — O09529 Supervision of elderly multigravida, unspecified trimester: Secondary | ICD-10-CM

## 2017-09-23 DIAGNOSIS — O99280 Endocrine, nutritional and metabolic diseases complicating pregnancy, unspecified trimester: Secondary | ICD-10-CM

## 2017-09-23 DIAGNOSIS — O134 Gestational [pregnancy-induced] hypertension without significant proteinuria, complicating childbirth: Principal | ICD-10-CM | POA: Diagnosis present

## 2017-09-23 DIAGNOSIS — O0990 Supervision of high risk pregnancy, unspecified, unspecified trimester: Secondary | ICD-10-CM

## 2017-09-23 DIAGNOSIS — O133 Gestational [pregnancy-induced] hypertension without significant proteinuria, third trimester: Secondary | ICD-10-CM | POA: Diagnosis present

## 2017-09-23 DIAGNOSIS — O368130 Decreased fetal movements, third trimester, not applicable or unspecified: Secondary | ICD-10-CM | POA: Diagnosis present

## 2017-09-23 DIAGNOSIS — O99824 Streptococcus B carrier state complicating childbirth: Secondary | ICD-10-CM | POA: Diagnosis present

## 2017-09-23 DIAGNOSIS — O99820 Streptococcus B carrier state complicating pregnancy: Secondary | ICD-10-CM

## 2017-09-23 DIAGNOSIS — O09523 Supervision of elderly multigravida, third trimester: Secondary | ICD-10-CM

## 2017-09-23 DIAGNOSIS — Z789 Other specified health status: Secondary | ICD-10-CM

## 2017-09-23 LAB — CBC
HEMATOCRIT: 36.8 % (ref 36.0–46.0)
HEMATOCRIT: 36.4 % (ref 36.0–46.0)
HEMOGLOBIN: 12.8 g/dL (ref 12.0–15.0)
HEMOGLOBIN: 12.6 g/dL (ref 12.0–15.0)
MCH: 31.2 pg (ref 26.0–34.0)
MCH: 31 pg (ref 26.0–34.0)
MCHC: 34.8 g/dL (ref 30.0–36.0)
MCHC: 34.6 g/dL (ref 30.0–36.0)
MCV: 89.8 fL (ref 78.0–100.0)
MCV: 89.4 fL (ref 78.0–100.0)
Platelets: 312 10*3/uL (ref 150–400)
Platelets: 281 10*3/uL (ref 150–400)
RBC: 4.1 MIL/uL (ref 3.87–5.11)
RBC: 4.07 MIL/uL (ref 3.87–5.11)
RDW: 13.8 % (ref 11.5–15.5)
RDW: 13.9 % (ref 11.5–15.5)
WBC: 11.9 10*3/uL — ABNORMAL HIGH (ref 4.0–10.5)
WBC: 20.8 10*3/uL — ABNORMAL HIGH (ref 4.0–10.5)

## 2017-09-23 LAB — COMPREHENSIVE METABOLIC PANEL
ALBUMIN: 2.8 g/dL — AB (ref 3.5–5.0)
ALK PHOS: 137 U/L — AB (ref 38–126)
ALT: 16 U/L (ref 14–54)
AST: 21 U/L (ref 15–41)
Anion gap: 10 (ref 5–15)
BILIRUBIN TOTAL: 0.5 mg/dL (ref 0.3–1.2)
BUN: 14 mg/dL (ref 6–20)
CO2: 18 mmol/L — ABNORMAL LOW (ref 22–32)
CREATININE: 0.5 mg/dL (ref 0.44–1.00)
Calcium: 9.2 mg/dL (ref 8.9–10.3)
Chloride: 109 mmol/L (ref 101–111)
GFR calc Af Amer: >60 mL/min (ref >60)
GLUCOSE: 88 mg/dL (ref 65–99)
POTASSIUM: 4.3 mmol/L (ref 3.5–5.1)
Sodium: 137 mmol/L (ref 135–145)
TOTAL PROTEIN: 6.5 g/dL (ref 6.5–8.1)

## 2017-09-23 LAB — TYPE AND SCREEN
ABO/RH(D): O POS
ANTIBODY SCREEN: NEGATIVE

## 2017-09-23 LAB — PROTEIN / CREATININE RATIO, URINE
Creatinine, Urine: 33 mg/dL
Protein Creatinine Ratio: 0.24 mg/mg{Cre} — ABNORMAL HIGH (ref 0.00–0.15)
Total Protein, Urine: 8 mg/dL

## 2017-09-23 LAB — ABO/RH: ABO/RH(D): O POS

## 2017-09-23 MED ORDER — PHENYLEPHRINE 40 MCG/ML (10ML) SYRINGE FOR IV PUSH (FOR BLOOD PRESSURE SUPPORT)
80.0000 ug | PREFILLED_SYRINGE | INTRAVENOUS | Status: DC | PRN
  Filled 2017-09-23: qty 5

## 2017-09-23 MED ORDER — OXYTOCIN BOLUS FROM INFUSION
500.0000 mL | Freq: Once | INTRAVENOUS | Status: AC
  Administered 2017-09-24: 500 mL via INTRAVENOUS

## 2017-09-23 MED ORDER — EPHEDRINE 5 MG/ML INJ
10.0000 mg | INTRAVENOUS | Status: DC | PRN
  Filled 2017-09-23: qty 2

## 2017-09-23 MED ORDER — LABETALOL HCL 5 MG/ML IV SOLN: 20.0000 mg | INTRAVENOUS | Status: DC | PRN

## 2017-09-23 MED ORDER — HYDRALAZINE HCL 20 MG/ML IJ SOLN: 10.0000 mg | Freq: Once | INTRAMUSCULAR | Status: DC | PRN

## 2017-09-23 MED ORDER — FENTANYL 2.5 MCG/ML BUPIVACAINE 1/10 % EPIDURAL INFUSION (WH - ANES)
14.0000 mL/h | INTRAMUSCULAR | Status: DC | PRN
  Administered 2017-09-23: 14 mL/h via EPIDURAL
  Filled 2017-09-23: qty 100

## 2017-09-23 MED ORDER — TERBUTALINE SULFATE 1 MG/ML IJ SOLN
0.2500 mg | Freq: Once | INTRAMUSCULAR | Status: DC | PRN
  Filled 2017-09-23: qty 1

## 2017-09-23 MED ORDER — OXYTOCIN 40 UNITS IN LACTATED RINGERS INFUSION - SIMPLE MED
2.5000 [IU]/h | INTRAVENOUS | Status: DC
  Administered 2017-09-24: 2.5 [IU]/h via INTRAVENOUS

## 2017-09-23 MED ORDER — ONDANSETRON HCL 4 MG/2ML IJ SOLN
4.0000 mg | Freq: Four times a day (QID) | INTRAMUSCULAR | Status: DC | PRN
  Administered 2017-09-23: 4 mg via INTRAVENOUS
  Filled 2017-09-23: qty 2

## 2017-09-23 MED ORDER — OXYTOCIN 40 UNITS IN LACTATED RINGERS INFUSION - SIMPLE MED: 1.0000 m[IU]/min | INTRAVENOUS | Status: DC

## 2017-09-23 MED ORDER — FENTANYL CITRATE (PF) 100 MCG/2ML IJ SOLN
100.0000 ug | INTRAMUSCULAR | Status: DC | PRN
  Administered 2017-09-23: 100 ug via INTRAVENOUS
  Filled 2017-09-23: qty 2

## 2017-09-23 MED ORDER — PHENYLEPHRINE 40 MCG/ML (10ML) SYRINGE FOR IV PUSH (FOR BLOOD PRESSURE SUPPORT)
80.0000 ug | PREFILLED_SYRINGE | INTRAVENOUS | Status: DC | PRN
  Filled 2017-09-23: qty 5
  Filled 2017-09-23: qty 10

## 2017-09-23 MED ORDER — SODIUM CHLORIDE 0.9 % IV SOLN
5.0000 10*6.[IU] | Freq: Once | INTRAVENOUS | Status: AC
  Administered 2017-09-23: 5 10*6.[IU] via INTRAVENOUS
  Filled 2017-09-23: qty 5

## 2017-09-23 MED ORDER — LABETALOL HCL 5 MG/ML IV SOLN
INTRAVENOUS | Status: AC
  Administered 2017-09-23: 20 mg
  Filled 2017-09-23: qty 4

## 2017-09-23 MED ORDER — LACTATED RINGERS IV SOLN: 500.0000 mL | INTRAVENOUS | Status: DC | PRN

## 2017-09-23 MED ORDER — LACTATED RINGERS IV SOLN
INTRAVENOUS | Status: DC
  Administered 2017-09-23 (×2): via INTRAVENOUS

## 2017-09-23 MED ORDER — PENICILLIN G POT IN DEXTROSE 60000 UNIT/ML IV SOLN
3.0000 10*6.[IU] | INTRAVENOUS | Status: DC
  Filled 2017-09-23 (×6): qty 50

## 2017-09-23 MED ORDER — DIPHENHYDRAMINE HCL 50 MG/ML IJ SOLN: 12.5000 mg | INTRAMUSCULAR | Status: DC | PRN

## 2017-09-23 MED ORDER — SOD CITRATE-CITRIC ACID 500-334 MG/5ML PO SOLN: 30.0000 mL | ORAL | Status: DC | PRN

## 2017-09-23 MED ORDER — OXYTOCIN 40 UNITS IN LACTATED RINGERS INFUSION - SIMPLE MED
1.0000 m[IU]/min | INTRAVENOUS | Status: DC
  Administered 2017-09-23: 4 m[IU]/min via INTRAVENOUS
  Administered 2017-09-23: 1 m[IU]/min via INTRAVENOUS
  Filled 2017-09-23: qty 1000

## 2017-09-23 MED ORDER — OXYCODONE-ACETAMINOPHEN 5-325 MG PO TABS: 1.0000 | ORAL_TABLET | ORAL | Status: DC | PRN

## 2017-09-23 MED ORDER — LACTATED RINGERS IV SOLN: 500.0000 mL | Freq: Once | INTRAVENOUS | Status: DC

## 2017-09-23 MED ORDER — OXYCODONE-ACETAMINOPHEN 5-325 MG PO TABS: 2.0000 | ORAL_TABLET | ORAL | Status: DC | PRN

## 2017-09-23 MED ORDER — LIDOCAINE HCL (PF) 1 % IJ SOLN
INTRAMUSCULAR | Status: DC | PRN
  Administered 2017-09-23: 13 mL via EPIDURAL

## 2017-09-23 MED ORDER — ACETAMINOPHEN 325 MG PO TABS: 650.0000 mg | ORAL_TABLET | ORAL | Status: DC | PRN

## 2017-09-23 MED ORDER — LIDOCAINE HCL (PF) 1 % IJ SOLN
30.0000 mL | INTRAMUSCULAR | Status: DC | PRN
  Filled 2017-09-23: qty 30

## 2017-09-23 MED ORDER — LACTATED RINGERS IV SOLN: INTRAVENOUS | Status: DC

## 2017-09-23 NOTE — Anesthesia Preprocedure Evaluation (Signed)
Anesthesia Evaluation  Patient identified by MRN, date of birth, ID band Patient awake    Reviewed: Allergy & Precautions, NPO status , Patient's Chart, lab work & pertinent test results  Airway Mallampati: II  TM Distance: >3 FB Neck ROM: Full    Dental no notable dental hx.    Pulmonary neg pulmonary ROS,    Pulmonary exam normal breath sounds clear to auscultation       Cardiovascular negative cardio ROS Normal cardiovascular exam Rhythm:Regular Rate:Normal     Neuro/Psych negative neurological ROS  negative psych ROS   GI/Hepatic negative GI ROS, Neg liver ROS,   Endo/Other  negative endocrine ROS  Renal/GU negative Renal ROS  negative genitourinary   Musculoskeletal negative musculoskeletal ROS (+)   Abdominal   Peds negative pediatric ROS (+)  Hematology negative hematology ROS (+)   Anesthesia Other Findings   Reproductive/Obstetrics negative OB ROS (+) Pregnancy                             Anesthesia Physical  Anesthesia Plan  ASA: II  Anesthesia Plan: Epidural   Post-op Pain Management:    Induction:   PONV Risk Score and Plan: Treatment may vary due to age or medical condition  Airway Management Planned:   Additional Equipment:   Intra-op Plan:   Post-operative Plan:   Informed Consent:   Plan Discussed with:   Anesthesia Plan Comments:         Anesthesia Quick Evaluation  

## 2017-09-23 NOTE — Progress Notes (Signed)
Subjective: Doing well, [name] @2100    Objective: BP 140/87   Pulse 75   Temp 98.4 F (36.9 C) (Oral)   Resp 18   Ht 5' (1.524 m)   Wt 69.1 kg (152 lb 4.8 oz)   LMP 12/20/2016   BMI 29.74 kg/m  No intake/output data recorded. No intake/output data recorded.  FHT:  FHR: 135 bpm, variability: moderate,  accelerations:  Abscent,  decelerations:  Absent UC:   irregular, every 3-5 minutes SVE:   Dilation: 4.5 Effacement (%): 80, 90 Station: -3, -2 Exam by:: Dorathy DaftShay Payne RN   Labs: Lab Results  Component Value Date   WBC 11.9 (H) 09/23/2017   HGB 12.8 09/23/2017   HCT 36.8 09/23/2017   MCV 89.8 09/23/2017   PLT 312 09/23/2017    Assessment / Plan: Induction of labor due to gestational hypertension, decreased fetal movement. TOLAC, s/p FB  Labor: Progressing on Pitocin, [name] @ 2100 Preeclampsia:  intermittent hypertension, labs stable Fetal Wellbeing:  Category I Pain Control:  IV pain meds I/D:  n/a Anticipated MOD:  NSVD  Evelyn Price 09/23/2017, 9:01 PM

## 2017-09-23 NOTE — Anesthesia Pain Management Evaluation Note (Signed)
  CRNA Pain Management Visit Note  Patient: Evelyn Price, 37 y.o., female  "Hello I am a member of the anesthesia team at [facility]. We have an anesthesia team available at all times to provide care throughout the hospital, including epidural management and anesthesia for C-section. I don't know your plan for the delivery whether it a natural birth, water birth, IV sedation, nitrous supplementation, doula or epidural, but we want to meet your pain goals."   1.Was your pain managed to your expectations on prior hospitalizations?   Yes   2.What is your expectation for pain management during this hospitalization?     Epidural  3.How can we help you reach that goal? Epidural when permitted.  Record the patient's initial score and the patient's pain goal.   Pain: 4  Pain Goal: 5 [facility] wants you to be able to say your pain was always managed very well.  Laurance Heide 09/23/2017

## 2017-09-23 NOTE — Anesthesia Procedure Notes (Signed)
Epidural Patient location during procedure: OB Start time: 09/23/2017 9:55 PM End time: 09/23/2017 10:17 PM  Staffing Anesthesiologist: Lowella CurbMiller, Emberlynn Riggan Ray, MD Performed: anesthesiologist   Preanesthetic Checklist Completed: patient identified, site marked, surgical consent, pre-op evaluation, timeout performed, IV checked, risks and benefits discussed and monitors and equipment checked  Epidural Patient position: sitting Prep: ChloraPrep Patient monitoring: heart rate, cardiac monitor, continuous pulse ox and blood pressure Approach: midline Location: L2-L3 Injection technique: LOR saline  Needle:  Needle type: Tuohy  Needle gauge: 17 G Needle length: 9 cm Needle insertion depth: 5 cm Catheter type: closed end flexible Catheter size: 20 Guage Catheter at skin depth: 9 cm Test dose: negative  Assessment Events: blood not aspirated, injection not painful, no injection resistance, negative IV test and no paresthesia  Additional Notes Reason for block:procedure for pain

## 2017-09-23 NOTE — H&P (Addendum)
LABOR AND DELIVERY ADMISSION HISTORY AND PHYSICAL NOTE  Evelyn Price is a 37 y.o. female G2P1001 with IUP at [redacted]w[redacted]d by LMP presenting for induction of labor in the setting of newly diagnosed gestational hypertension. This is a TOLAC after c-section for failure to descend. Pregnancy has been further complicated by Hyperthyroidism, AMA, multigravida, and GBS carrier.  Patient was seen in the office earlier today with newly elevated blood pressures to 146/91, prompting admission for newly diagnosed gestational hypertension given gestational age and concern for decreased fetal movement.     No headache, blurry vision, edema, chest pain or shortness of breath.   She reports having medication for hyperthyroidism stopped during this pregnancy and reports normal labs not requiring any additional medication.   She reports positive fetal movement, and this has improved since being seen in the office. She denies leakage of fluid or vaginal bleeding.  Prenatal History/Complications:  Past Medical History: Past Medical History:  Diagnosis Date  . Allergy    seasonal  . Hyperthyroidism   . Thyroid disease    Hyperthyroidism    Past Surgical History: Past Surgical History:  Procedure Laterality Date  . CESAREAN SECTION      Obstetrical History: OB History    Gravida  2   Para  1   Term  1   Preterm      AB  0   Living  1     SAB      TAB      Ectopic  0   Multiple      Live Births  1           Social History: Social History   Socioeconomic History  . Marital status: Married    Spouse name: N/A  . Number of children: 1  . Years of education: 53  . Highest education level: Not on file  Occupational History  . Occupation: Education officer, environmental  Social Needs  . Financial resource strain: Not on file  . Food insecurity:    Worry: Not on file    Inability: Not on file  . Transportation needs:    Medical: Not on file    Non-medical: Not on file  Tobacco Use   . Smoking status: Never Smoker  . Smokeless tobacco: Never Used  Substance and Sexual Activity  . Alcohol use: No    Alcohol/week: 0.0 oz  . Drug use: No  . Sexual activity: Yes    Partners: Male    Birth control/protection: Condom, None  Lifestyle  . Physical activity:    Days per week: Not on file    Minutes per session: Not on file  . Stress: Not on file  Relationships  . Social connections:    Talks on phone: Not on file    Gets together: Not on file    Attends religious service: Not on file    Active member of club or organization: Not on file    Attends meetings of clubs or organizations: Not on file    Relationship status: Not on file  Other Topics Concern  . Not on file  Social History Narrative   From [place], [place]. Came to the [place] in 1999   Marital status: single.  Dating same boyfriend x 1 year.     Children: one son     Living with: son     Employment: cleaning      Tobacco: none      Alcohol: none      Drugs:  none    Family History: No family history on file.  Allergies: No Known Allergies  Medications Prior to Admission  Medication Sig Dispense Refill Last Dose  . calcium carbonate (TUMS - DOSED IN MG ELEMENTAL CALCIUM) 500 MG chewable tablet Chew 1 tablet by mouth daily.   Taking  . loratadine (CLARITIN) 10 MG tablet Take 10 mg by mouth daily.   Taking  . Prenatal Vit-Fe Fumarate-FA (PRENATAL VITAMIN PO) Take by mouth.   Taking     Review of Systems   All systems reviewed and negative except as stated in HPI  Blood pressure (!) 159/95, pulse 81, last menstrual period 12/20/2016. General appearance: alert, cooperative and no distress Lungs: clear to auscultation bilaterally Heart: regular rate and rhythm Abdomen: soft, non-tender; bowel sounds normal Extremities: No calf swelling or tenderness Presentation: cephalic, vertex, confirmed with ultrasound Fetal monitoring: 135, +accels, -decels, mod variability Uterine activity: contractions  q2-33min     Prenatal labs: ABO, Rh: O/Positive/-- (10/15 0000) Antibody: Negative (10/15 0000) Rubella: Immune (10/15 0000) RPR: Non Reactive (02/01 1346)  HBsAg: Negative (10/15 0000)  HIV: Non Reactive (02/01 1346)  GBS: Positive (04/03 0000)  1 hr Glucola: early neg (97) and 3rd trimester neg Genetic screening:  Declined due to cost Anatomy US: normal  Prenatal Transfer Tool  Maternal Diabetes: No Genetic Screening: Declined Maternal Ultrasounds/Referrals: Normal Fetal Ultrasounds or other Referrals:  None Maternal Substance Abuse:  No Significant Maternal Medications:  None Significant Maternal Lab Results: Lab values include: Group B Strep positive  No results found for this or any previous visit (from the past 24 hour(s)).  Patient Active Problem List   Diagnosis Date Noted  . Group B Streptococcus carrier, antepartum 09/09/2017  . Language barrier 07/04/2017  . Supervision of high risk pregnancy, antepartum 04/08/2017  . AMA (advanced maternal age) multigravida 35+ 04/08/2017  . Hyperthyroidism affecting pregnancy 04/08/2017  . Previous cesarean section complicating pregnancy 04/08/2017  . Hyperthyroidism 09/19/2012    Assessment: Evelyn Price is a 37 y.o. G2P1001 at [redacted]w[redacted]d here for induction of labor secondary to newly diagnosed gestational hypertension in the office earlier today. History is notable for TOLAC.   #Labor: Patient with bishop score of 3. Not a candidate for cytotec secondary to TOLAC. Foley bulb placement with speculum, with low dose pitocin for cervical ripening.  #Gestational HTN: tox panel labs currently pending; monitor BP closely #Pain: Undecided on epidural, but thinks she may want #FWB: Cat I; reassuring as noted above #ID:  GBS Positive; will start PCN #MOF: breast #MOC: OCP #Circ:  N/a; girl  Lise Auer, MD PGY-2 09/23/2017, 10:21 AM  CNM attestation:  I have seen and examined this patient; I agree with above  documentation in the resident's note.   Evelyn Price is a 37 y.o. G2P1001 here for IOL due to dx of gHTN in the office today; denies s/s of pre-e; is desirous at this time of TOLAC  PE: BP 130/85   Pulse 79   Temp 98 F (36.7 C) (Oral)   Resp 16   LMP 12/20/2016  Gen: calm comfortable, NAD Resp: normal effort, no distress Abd: gravid Cx: post  ROS, labs, PMH reviewed  Plan: Admit to [employer] Cx ripening with foley placement and low dose Pit; plan to titrate Pit to achieve adequate labor once foley comes out CBC/CMET not concerning for pre-e; urine P/C ratio still pending BPs not in severe range currently Begin PCN with active labor/ROM  Cam Hai CNM  09/23/2017, 12:55 PM

## 2017-09-23 NOTE — Progress Notes (Signed)
Subjective: Doing well with no complaints, pain well controlled s/p epidural.   Objective: BP (!) 140/97   Pulse 91   Temp 98.3 F (36.8 C) (Oral)   Resp 18   Ht 5' (1.524 m)   Wt 69.1 kg (152 lb 4.8 oz)   LMP 12/20/2016   SpO2 100%   BMI 29.74 kg/m  No intake/output data recorded. No intake/output data recorded.  FHT:  FHR: 140 bpm, variability: moderate,  accelerations:  Abscent,  decelerations:  Present scattered late decels UC:   regular, every 3-4 minutes SVE:   Dilation: 8.5 Effacement (%): 90 Station: Plus 1 Exam by:: Evelyn Price  Labs: Lab Results  Component Value Date   WBC 20.8 (H) 09/23/2017   HGB 12.6 09/23/2017   HCT 36.4 09/23/2017   MCV 89.4 09/23/2017   PLT 281 09/23/2017    Assessment / Plan: Induction of labor due to gestational hypertension,  Decreased fetal movement. TOLAC. S/P foley bulb. Placed IUPC and started amnioinfusion.  Labor: Progressing on Pitocin. IUPC in place w/ amnioinfusion. Preeclampsia:  intermittent hypertension, labs stable Fetal Wellbeing:  Category I Pain Control:  IV pain meds I/D:  n/a Anticipated MOD:  NSVD  Evelyn Price 09/23/2017, 11:07 PM

## 2017-09-23 NOTE — MAU Note (Signed)
Pt is a direct admit from clinic

## 2017-09-23 NOTE — Progress Notes (Signed)
   PRENATAL VISIT NOTE  Subjective:  Evelyn Price is a 37 y.o. G2P1001 at 241w4d being seen today for ongoing prenatal care.  She is currently monitored for the following issues for this low-risk pregnancy and has Hyperthyroidism; Supervision of high risk pregnancy, antepartum; AMA (advanced maternal age) multigravida 35+; Hyperthyroidism affecting pregnancy; Previous cesarean section complicating pregnancy; Language barrier; and Group B Streptococcus carrier, antepartum on their problem list.  Patient reports no complaints.  Contractions: Irregular. Vag. Bleeding: None.  Movement: (!) Decreased. Denies leaking of fluid.   The following portions of the patient's history were reviewed and updated as appropriate: allergies, current medications, past family history, past medical history, past social history, past surgical history and problem list. Problem list updated.  Objective:   Vitals:   09/23/17 0824 09/23/17 0827  BP: (!) 146/91 (!) 144/95  Pulse: 75   Weight: 152 lb 4.8 oz (69.1 kg)     Fetal Status: Fetal Heart Rate (bpm): 128   Movement: (!) Decreased     General:  Alert, oriented and cooperative. Patient is in no acute distress.  Skin: Skin is warm and dry. No rash noted.   Cardiovascular: Normal heart rate noted  Respiratory: Normal respiratory effort, no problems with respiration noted  Abdomen: Soft, gravid, appropriate for gestational age.  Pain/Pressure: Present     Pelvic: Cervical exam performed        Extremities: Normal range of motion.  Edema: Mild pitting, slight indentation  Mental Status: Normal mood and affect. Normal behavior. Normal judgment and thought content.   Assessment and Plan:  Pregnancy: G2P1001 at 591w4d  1. Multigravida of advanced maternal age in third trimester - low risk NIPS  2. Supervision of high risk pregnancy, antepartum  + GBS- treat in labor 3. Previous cesarean section complicating pregnancy -signed consent for  TOLAC  4. Language barrier -interpretor present  5. Hyperthyroidism  6. New onset HTN at term- plan for TOLAC, check labs for pre eclampsia  Preterm labor symptoms and general obstetric precautions including but not limited to vaginal bleeding, contractions, leaking of fluid and fetal movement were reviewed in detail with the patient. Please refer to After Visit Summary for other counseling recommendations.  No follow-ups on file.  No future appointments.  Allie BossierMyra C Jonan Seufert, MD

## 2017-09-24 ENCOUNTER — Encounter (HOSPITAL_COMMUNITY): Payer: Self-pay

## 2017-09-24 ENCOUNTER — Other Ambulatory Visit: Payer: Self-pay

## 2017-09-24 DIAGNOSIS — O34219 Maternal care for unspecified type scar from previous cesarean delivery: Secondary | ICD-10-CM | POA: Diagnosis not present

## 2017-09-24 DIAGNOSIS — O99824 Streptococcus B carrier state complicating childbirth: Secondary | ICD-10-CM

## 2017-09-24 DIAGNOSIS — O134 Gestational [pregnancy-induced] hypertension without significant proteinuria, complicating childbirth: Secondary | ICD-10-CM

## 2017-09-24 DIAGNOSIS — Z3A39 39 weeks gestation of pregnancy: Secondary | ICD-10-CM

## 2017-09-24 LAB — RPR: RPR Ser Ql: NONREACTIVE

## 2017-09-24 MED ORDER — TETANUS-DIPHTH-ACELL PERTUSSIS 5-2.5-18.5 LF-MCG/0.5 IM SUSP: 0.5000 mL | Freq: Once | INTRAMUSCULAR | Status: DC

## 2017-09-24 MED ORDER — WITCH HAZEL-GLYCERIN EX PADS: 1.0000 "application " | MEDICATED_PAD | CUTANEOUS | Status: DC | PRN

## 2017-09-24 MED ORDER — IBUPROFEN 600 MG PO TABS
600.0000 mg | ORAL_TABLET | Freq: Four times a day (QID) | ORAL | Status: DC
  Administered 2017-09-24 – 2017-09-26 (×9): 600 mg via ORAL
  Filled 2017-09-24 (×9): qty 1

## 2017-09-24 MED ORDER — ACETAMINOPHEN 325 MG PO TABS: 650.0000 mg | ORAL_TABLET | ORAL | Status: DC | PRN

## 2017-09-24 MED ORDER — DIBUCAINE 1 % RE OINT: 1.0000 "application " | TOPICAL_OINTMENT | RECTAL | Status: DC | PRN

## 2017-09-24 MED ORDER — PRENATAL MULTIVITAMIN CH
1.0000 | ORAL_TABLET | Freq: Every day | ORAL | Status: DC
  Administered 2017-09-24 – 2017-09-26 (×3): 1 via ORAL
  Filled 2017-09-24 (×3): qty 1

## 2017-09-24 MED ORDER — SENNOSIDES-DOCUSATE SODIUM 8.6-50 MG PO TABS
2.0000 | ORAL_TABLET | ORAL | Status: DC
  Administered 2017-09-25 (×2): 2 via ORAL
  Filled 2017-09-24 (×2): qty 2

## 2017-09-24 MED ORDER — ONDANSETRON HCL 4 MG PO TABS: 4.0000 mg | ORAL_TABLET | ORAL | Status: DC | PRN

## 2017-09-24 MED ORDER — COCONUT OIL OIL: 1.0000 "application " | TOPICAL_OIL | Status: DC | PRN

## 2017-09-24 MED ORDER — BENZOCAINE-MENTHOL 20-0.5 % EX AERO
1.0000 "application " | INHALATION_SPRAY | CUTANEOUS | Status: DC | PRN
  Filled 2017-09-24: qty 56

## 2017-09-24 MED ORDER — ZOLPIDEM TARTRATE 5 MG PO TABS: 5.0000 mg | ORAL_TABLET | Freq: Every evening | ORAL | Status: DC | PRN

## 2017-09-24 MED ORDER — SIMETHICONE 80 MG PO CHEW: 80.0000 mg | CHEWABLE_TABLET | ORAL | Status: DC | PRN

## 2017-09-24 MED ORDER — ONDANSETRON HCL 4 MG/2ML IJ SOLN: 4.0000 mg | INTRAMUSCULAR | Status: DC | PRN

## 2017-09-24 MED ORDER — DIPHENHYDRAMINE HCL 25 MG PO CAPS: 25.0000 mg | ORAL_CAPSULE | Freq: Four times a day (QID) | ORAL | Status: DC | PRN

## 2017-09-24 NOTE — Lactation Note (Signed)
This note was copied from a baby's chart. Lactation Consultation Note  Patient Name: Girl Sibyl ParrLuz Delia Cortazar-Martinez [id number]'s Date: 09/24/2017 Reason for consult: (S) Initial assessment;Term;Infant < 6lbs;Other (Comment)(low blood glucose) Baby placed skin to skin with mom.  Baby sleepy and showing little interest in feeding.  Attempted to latch baby for several minutes with only a few sucks elicited.  Mom hand expressed into spoon and baby took 2 mls of colostrum.  Baby left skin to skin with mom.  Maternal Data Has patient been taught Hand Expression?: Yes Does the patient have breastfeeding experience prior to this delivery?: Yes  Feeding Feeding Type: Breast Fed Nipple Type: Regular  LATCH Score Latch: Too sleepy or reluctant, no latch achieved, no sucking elicited.  Audible Swallowing: None  Type of Nipple: Everted at rest and after stimulation  Comfort (Breast/Nipple): Soft / non-tender  Hold (Positioning): No assistance needed to correctly position infant at breast.  LATCH Score: 6  Interventions Interventions: Breast feeding basics reviewed;Assisted with latch;Breast compression;Skin to skin;Adjust position;Breast massage;Support pillows;Hand express  Lactation Tools Discussed/Used     Consult Status Consult Status: Follow-up Date: 09/25/17 Follow-up type: In-patient    Huston FoleyMOULDEN, Maryrose Colvin S 09/24/2017, 11:28 AM

## 2017-09-24 NOTE — Anesthesia Postprocedure Evaluation (Signed)
Anesthesia Post Note  Patient: Evelyn Price  Procedure(s) Performed: AN AD HOC LABOR EPIDURAL     Patient location during evaluation: Mother Baby Anesthesia Type: Epidural Level of consciousness: awake, awake and alert and oriented Pain management: pain level controlled Vital Signs Assessment: post-procedure vital signs reviewed and stable Respiratory status: spontaneous breathing Cardiovascular status: blood pressure returned to baseline Postop Assessment: no headache, no backache, epidural receding, patient able to bend at knees, no apparent nausea or vomiting and adequate PO intake Anesthetic complications: no    Last Vitals:  Vitals:   09/24/17 0215 09/24/17 0315  BP: 136/83 133/80  Pulse: 83 88  Resp: 18   Temp: 36.9 C 37.1 C  SpO2: 100% 100%    Last Pain:  Vitals:   09/24/17 0555  TempSrc:   PainSc: 0-No pain   Pain Goal:                 Cleda ClarksBrowder, [name] R

## 2017-09-25 NOTE — Progress Notes (Signed)
Infant transferred to NICU this morning. Patient started on the DEBP. No questions at this time but said she will ask if she has any

## 2017-09-25 NOTE — Progress Notes (Signed)
Post Partum Day 1 Subjective: Patient reports feeling well. She is visibly upset as her newborn was taken to the NICU for further evaluation and testing (hyperbilirubinemia and sepsis work up)  Objective: Blood pressure 135/85, pulse 77, temperature 98 F (36.7 C), resp. rate 18, height 5' (1.524 m), weight 152 lb 4.8 oz (69.1 kg), last menstrual period 12/20/2016, SpO2 100 %, unknown if currently breastfeeding.  Physical Exam:  General: alert, cooperative and no distress Lochia: appropriate Uterine Fundus: firm DVT Evaluation: No evidence of DVT seen on physical exam.  Recent Labs    09/23/17 1058 09/23/17 2109  HGB 12.8 12.6  HCT 36.8 36.4    Assessment/Plan: Emotional support provided Continue routine postpartum care Plan for discharge tomorrow   LOS: 2 days   Jeanine Caven 09/25/2017, 11:00 AM

## 2017-09-26 DIAGNOSIS — O133 Gestational [pregnancy-induced] hypertension without significant proteinuria, third trimester: Secondary | ICD-10-CM | POA: Diagnosis present

## 2017-09-26 MED ORDER — IBUPROFEN 600 MG PO TABS: 600.0000 mg | ORAL_TABLET | Freq: Four times a day (QID) | ORAL | 0 refills | Status: DC | PRN

## 2017-09-26 MED ORDER — TRIAMTERENE-HCTZ 37.5-25 MG PO TABS: 1.0000 | ORAL_TABLET | Freq: Every day | ORAL | 2 refills | Status: DC

## 2017-09-26 MED ORDER — TRIAMTERENE-HCTZ 37.5-25 MG PO TABS
1.0000 | ORAL_TABLET | Freq: Every day | ORAL | Status: DC
  Administered 2017-09-26: 1 via ORAL
  Filled 2017-09-26 (×2): qty 1

## 2017-09-26 NOTE — Clinical Social Work Maternal (Signed)
CLINICAL SOCIAL WORK MATERNAL/CHILD NOTE  Patient Details  Name: Evelyn Price MRN: [id number] Date of Birth: 07/31/1980  Date:  09/26/2017  Clinical Social Worker Initiating Note:  Shelina Luo Boyd-Gilyard Date/Time: Initiated:  09/26/17/1420     Child's Name:      Biological Parents:  Mother, Father   Need for Interpreter:  Spanish   Reason for Referral:  Parental Support of Premature Babies < 32 weeks/or Critically Ill babies   Address:  [address] [place] [address]    Phone number:  [phone] (home)     Additional phone number:   Household Members/Support Persons (HM/SP):   Household Member/Support Person 1, Household Member/Support Person 2   HM/SP Name Relationship DOB or Age  HM/SP -1 [name] Martinez FOB/Husband unknown  HM/SP -2 Edwardia Cortazar son 10/28/04  HM/SP -3        HM/SP -4        HM/SP -5        HM/SP -6        HM/SP -7        HM/SP -8          Natural Supports (not living in the home):  Friends, Immediate Family, Neighbors   Professional Supports: None   Employment:     Type of Work:     Education:      Homebound arranged:    Financial Resources:  Medicaid   Other Resources:      Cultural/Religious Considerations Which May Impact Care:  None Reported  Strengths:  Ability to meet basic needs , Home prepared for child    Psychotropic Medications:         Pediatrician:       Pediatrician List:   [facility]    [place]    [place]    [place]    [place]    [place]      Pediatrician Fax Number:    Risk Factors/Current Problems:  None   Cognitive State:  Able to Concentrate , Alert , Insightful , Linear Thinking    Mood/Affect:  Relaxed , Calm , Interested , Comfortable    CSW Assessment: CSW met with MOB in room 108 to complete an assessment for NICU admission. When CSW arrived with hospital's Spanish Speaking interpreter, MOB was dressed appropriate  and appeared to be ready for discharge. MOB was polite and receptive to meeting with CSW.  MOB's affect and mood was congruent for the situation.    CSW asked about MOB's thoughts and feelings since infant's admission to the NICU.  MOB became tearful and expressed that she feels sad because she has discharge without her baby.  CSW validated and normalized MOB's thoughts and feelings.  CSW discussed other common emotions often experienced related to a NICU admission as well as during the first couple weeks of the postpartum period.   CSW provided education regarding the baby blues period vs. perinatal mood disorders, discussed treatment and gave resources for mental health follow up if concerns arise.  CSW recommends self-evaluation during the postpartum time period using the New Mom Checklist from Postpartum Progress and encouraged MOB to contact a medical professional if symptoms are noted at any time. CSW assessed for safety and MOB denied SI and HI.  MOB appeared to have insight and awareness and agreed to seek help if help is needed.   MOB reported a strong support team and denied any barrier with MOB and FOB visiting with infant as often   as they like.   CSW discussed SSI in which baby qualifies due to medical conditions. MOB was  interested in applying and CSW explained process and steps. CSW will follow up with MOB in getting information about SSI.    CSW will continue to assess family fo psychosocial stressors and provide resources and supports while infant remains in NICU.   CSW Plan/Description:  Psychosocial Support and Ongoing Assessment of Needs, Perinatal Mood and Anxiety Disorder (PMADs) Education, Other Information/Referral to [employer], [place] (SSI) Information   Laurey Arrow, MSW, LCSW Clinical Social Work [phone]  Dimple Nanas, LCSW 09/26/2017, 3:23 PM

## 2017-09-26 NOTE — Progress Notes (Signed)
Pt has been in NICU with baby all morning. Evelyn Price, Evelyn Price

## 2017-09-26 NOTE — Progress Notes (Signed)
Discharge instructions reviewed with patient via [place] interpreter.  Reviewed medications and answered any questions and concerns.

## 2017-09-26 NOTE — Discharge Instructions (Signed)
Hipertensión posparto °(Postpartum Hypertension) °La hipertensión posparto es cuando la presión arterial se mantiene más alta que lo normal durante más de dos días después del parto. Puede no darse cuenta de que tiene hipertensión posparto si no le miden la presión arterial con regularidad. En algunos casos, la hipertensión posparto desaparece sola, por lo general en la semana posterior al parto. Sin embargo, algunas mujeres requieren tratamiento médico para prevenir complicaciones graves, como convulsiones o un ictus. °Entre las cosas que pueden influir en la presión arterial, se incluye lo siguiente: °· El tipo de parto. °· La administración de líquidos u otros medicamentos por vía intravenosa durante o después del parto. °CAUSAS °La hipertensión posparto puede ser consecuencia de cualquiera de los siguientes factores o de la combinación de cualquiera de ellos: °· Hipertensión que existía antes del embarazo (hipertensión crónica). °· Hipertensión gestacional. °· Preeclampsia o eclampsia. °· Administración de mucho líquido a través de una vía intravenosa durante o después del parto. °· Medicamentos. °· Síndrome de HELLP °· Hipertiroidismo. °· Ictus. °· Otros trastornos neurológicos o sanguíneos poco frecuentes. °En algunos casos, es posible que la causa no se conozca. °FACTORES DE RIESGO °La hipertensión posparto puede relacionarse con uno o más factores de riesgo, por ejemplo: °· Hipertensión crónica. En algunos casos, es posible que esta no se haya diagnosticado antes del embarazo. °· Obesidad. °· Diabetes tipo 2. °· Enfermedad renal. °· Antecedentes familiares de preeclampsia. °· Otras enfermedades que causan desequilibrios hormonales. °SIGNOS Y SÍNTOMAS °Al igual que con todos los tipos de hipertensión, la hipertensión posparto puede no causar ningún síntoma. Según lo alta que esté la presión arterial, puede presentar lo siguiente: °· Dolores de cabeza. Estos pueden ser leves, moderados o intensos. También  pueden ser regulares, constantes o de inicio repentino (cefalea en estallido). °· Cambios en la visión. °· Mareos. °· Falta de aire. °· Hinchazón de las manos, los pies, la parte inferior de las piernas o la cara. En algunos casos, puede tener hinchazón en varias de estas áreas. °· Palpitaciones o latidos cardíacos acelerados. °· Dificultad para respirar al estar acostada. °· Disminución de la cantidad de orina. °Otros signos y síntomas poco frecuentes pueden incluir lo siguiente: °· Más sudoración que la habitual. Esta dura más que unos días después del parto. °· Dolor en el pecho. °· Mareos repentinos al levantarse después de haber estado sentada o acostada. °· Convulsiones. °· Náuseas o vómitos. °· Dolor abdominal. °DIAGNÓSTICO °El diagnóstico de hipertensión posparto se establece mediante la combinación de los resultados de los exámenes físicos y los análisis de sangre y orina. También pueden realizarle estudios adicionales, como una tomografía computarizada o una resonancia magnética, para detectar otras complicaciones de la hipertensión posparto. °TRATAMIENTO °Cuando la presión arterial está lo suficientemente alta como para requerir tratamiento, las opciones incluyen lo siguiente: °· Medicamentos para disminuir la presión arterial (antihipertensivos). Dígale al médico si está amamantando o si planifica hacerlo. Hay muchos medicamentos antihipertensivos que pueden tomarse sin riesgos durante la lactancia. °· Interrupción de los medicamentos que puedan causar la hipertensión. °· Tratamiento de las enfermedades que causan la hipertensión. °· Tratamiento de las complicaciones de la hipertensión, como convulsiones, ictus o problemas renales. °El médico seguirá supervisando atentamente y de forma repetida su presión arterial hasta que esta se encuentre en un nivel seguro para usted. °INSTRUCCIONES PARA EL CUIDADO EN EL HOGAR °· Tome los medicamentos solamente como se lo haya indicado el médico. °· Haga actividad  física con regularidad después de que el médico le diga que es   seguro hacerlo. °· Siga las recomendaciones de su médico sobre los límites en el consumo de sal y líquido. °· No consuma ningún producto que contenga tabaco, lo que incluye cigarrillos, tabaco de mascar o cigarrillos electrónicos. Si necesita ayuda para dejar de fumar, consulte al médico. °· Concurra a todas las visitas de control como se lo haya indicado el médico. Esto es importante. °SOLICITE ATENCIÓN MÉDICA SI: °· Los síntomas empeoran. °· Aparecen nuevos síntomas, por ejemplo: °? Dolor de cabeza. °? Mareos. °? Cambios en la visión. °SOLICITE ATENCIÓN MÉDICA DE INMEDIATO SI: °· Tiene un dolor de cabeza intenso o repentino. °· Tiene convulsiones. °· Siente debilidad o adormecimiento en un lado del cuerpo. °· Tiene dificultades para pensar, hablar o tragar. °· Sufre un dolor abdominal intenso. °· Tiene dificultad para respirar, dolor en el pecho, latidos cardíacos acelerados o palpitaciones. °Estos síntomas pueden representar un problema grave que constituye una emergencia. No espere hasta que los síntomas desaparezcan. Solicite atención médica de inmediato. Comuníquese con el servicio de emergencias de su localidad (911 en los [place]). No conduzca por sus propios medios hasta el hospital. °Esta información no tiene como fin reemplazar el consejo del médico. Asegúrese de hacerle al médico cualquier pregunta que tenga. °Document Released: 03/04/2014 Document Revised: 03/04/2014 Document Reviewed: 12/02/2013 °Elsevier Interactive Patient Education © 2018 Elsevier Inc. ° °

## 2017-09-26 NOTE — Discharge Summary (Signed)
OB Discharge Summary     Patient Name: Evelyn ParrLuz Delia Cortazar-Martinez DOB: 02-04-1981 MRN: [id number]  Date of admission: 09/23/2017 Delivering MD: Myrene BuddyFLETCHER, JACOB   Date of discharge: 09/26/2017  Admitting diagnosis: 39WKS, HIGH BP Intrauterine pregnancy: 5744w5d     Secondary diagnosis:  Principal Problem:   Supervision of high risk pregnancy, antepartum Active Problems:   Hyperthyroidism   AMA (advanced maternal age) multigravida 35+   Hyperthyroidism affecting pregnancy   Previous cesarean section complicating pregnancy   Group B Streptococcus carrier, antepartum   VBAC, delivered   Gestational hypertension w/o significant proteinuria in 3rd trimester  Additional problems: none     Discharge diagnosis: Term Pregnancy Delivered, VBAC and Gestational Hypertension                                                                                                Post partum procedures:none  Augmentation: Pitocin and Foley Balloon  Complications: None  Hospital course:  Induction of Labor With Vaginal Delivery   37 y.o. yo [id number] at 5444w5d was admitted to the hospital 09/23/2017 for induction of labor.  Indication for induction: Gestational hypertension. Her pre-e labs were no and she did not have severe range BPs. Patient had an uncomplicated labor course as follows: Membrane Rupture Time/Date: 8:56 PM ,09/23/2017   Intrapartum Procedures: Episiotomy: None [1]                                         Lacerations:  Sulcus [9];Labial [10]  Patient had delivery of a Viable infant.  Information for the patient's newborn:  Elvera LennoxCortazar-Martinez, Girl Glori LuisLuz Delia [[id number]][[id number]]  Delivery Method: VBAC, Spontaneous(Filed from Delivery Summary)   09/24/2017  Details of delivery can be found in separate delivery note.  Patient had a routine postpartum course. Patient is discharged home 09/26/17. Prior to d/c she was started on Maxzide for mildly elevated BPs.  Physical exam  Vitals:   09/24/17 2300  09/25/17 0500 09/25/17 1722 09/26/17 0601  BP: 138/80 135/85 (!) 145/96 134/83  Pulse: 80 77 74 73  Resp: 18 18 17 16   Temp:  98 F (36.7 C) 98.1 F (36.7 C) 97.8 F (36.6 C)  TempSrc:   Oral Oral  SpO2: 100% 100% 100%   Weight:      Height:       General: alert and cooperative Lochia: appropriate Uterine Fundus: firm Incision: N/A DVT Evaluation: No evidence of DVT seen on physical exam. Labs: Lab Results  Component Value Date   WBC 20.8 (H) 09/23/2017   HGB 12.6 09/23/2017   HCT 36.4 09/23/2017   MCV 89.4 09/23/2017   PLT 281 09/23/2017   CMP Latest Ref Rng & Units 09/23/2017  Glucose 65 - 99 mg/dL 88  BUN 6 - 20 mg/dL 14  Creatinine 1.300.44 - 8.651.00 mg/dL 7.840.50  Sodium 696135 - 295145 mmol/L 137  Potassium 3.5 - 5.1 mmol/L 4.3  Chloride 101 - 111 mmol/L 109  CO2 22 - 32 mmol/L 18(L)  Calcium 8.9 -  10.3 mg/dL 9.2  Total Protein 6.5 - 8.1 g/dL 6.5  Total Bilirubin 0.3 - 1.2 mg/dL 0.5  Alkaline Phos 38 - 126 U/L 137(H)  AST 15 - 41 U/L 21  ALT 14 - 54 U/L 16    Discharge instruction: per After Visit Summary and "Baby and Me Booklet".  After visit meds:  Allergies as of 09/26/2017   No Known Allergies     Medication List    TAKE these medications   calcium carbonate 500 MG chewable tablet Commonly known as:  TUMS - dosed in mg elemental calcium Chew 1 tablet by mouth daily.   ibuprofen 600 MG tablet Commonly known as:  ADVIL,MOTRIN Take 1 tablet (600 mg total) by mouth every 6 (six) hours as needed.   loratadine 10 MG tablet Commonly known as:  CLARITIN Take 10 mg by mouth daily.   PRENATAL VITAMIN PO Take by mouth.   triamterene-hydrochlorothiazide 37.5-25 MG tablet Commonly known as:  MAXZIDE-25 Take 1 tablet by mouth daily.       Diet: routine diet  Activity: Advance as tolerated. Pelvic rest for 6 weeks.   Outpatient follow up:1wk BP check; 4wk PP visit Follow up Appt: Future Appointments  Date Time Provider Department Center  10/13/2017  2:55 PM  Sharyon Cable, CNM [facility]   Follow up Visit:No follow-ups on file.  Postpartum contraception: Progesterone only pills  Newborn Data: Live born female  Birth Weight: 5 lb 13 oz (2637 g) APGAR: 8, 9  Newborn Delivery   Birth date/time:  09/24/2017 00:09:00 Delivery type:  VBAC, Spontaneous     Baby Feeding: Breast/pumping Disposition:NICU   09/26/2017 Cam Hai, CNM 1:59 PM

## 2017-10-01 ENCOUNTER — Telehealth: Payer: Self-pay | Admitting: General Practice

## 2017-10-01 NOTE — Telephone Encounter (Signed)
Called patient (with interpreter) to schedule nurse visit for BP check.  Interpreter left message on VM for patient to give our office a call back in regards to appointment on Friday, 10/03/17 at 2:00pm.

## 2017-10-03 ENCOUNTER — Ambulatory Visit (INDEPENDENT_AMBULATORY_CARE_PROVIDER_SITE_OTHER): Payer: Self-pay | Admitting: *Deleted

## 2017-10-03 VITALS — BP 136/93 | HR 71 | Wt 130.7 lb

## 2017-10-03 DIAGNOSIS — R300 Dysuria: Secondary | ICD-10-CM

## 2017-10-03 NOTE — Progress Notes (Signed)
Here for bp check . Vag delivery 09/24/17 with GHTN. Reported bp's to Luna Kitchens, CNM. No changes in plan . Keep pp appt as scheduled 10/13/17; keep taking bp med, come to [facility] for c/o severe ha, edema. Also c/o burning with urination. We discussed may be due to her stitches ; but will do UA to r/o UTI. UA appears likely negative ; will send for culture to be sure.

## 2017-10-04 NOTE — Progress Notes (Signed)
Chart reviewed for nurse visit. Agree with plan of care.   Denvil Canning Lorraine, CNM 10/04/2017 2:31 PM   

## 2017-10-05 LAB — URINE CULTURE

## 2017-10-06 LAB — POCT URINALYSIS DIP (DEVICE)
BILIRUBIN URINE: NEGATIVE
GLUCOSE, UA: NEGATIVE mg/dL
Ketones, ur: NEGATIVE mg/dL
NITRITE: NEGATIVE
Protein, ur: NEGATIVE mg/dL
SPECIFIC GRAVITY, URINE: 1.01 (ref 1.005–1.030)
UROBILINOGEN UA: 0.2 mg/dL (ref 0.0–1.0)
pH: 6 (ref 5.0–8.0)

## 2017-10-13 ENCOUNTER — Encounter: Payer: Self-pay | Admitting: Family Medicine

## 2017-10-13 ENCOUNTER — Ambulatory Visit: Payer: Self-pay | Admitting: Certified Nurse Midwife

## 2018-04-21 ENCOUNTER — Ambulatory Visit (INDEPENDENT_AMBULATORY_CARE_PROVIDER_SITE_OTHER): Payer: Self-pay | Admitting: Physician Assistant

## 2018-04-21 ENCOUNTER — Encounter (INDEPENDENT_AMBULATORY_CARE_PROVIDER_SITE_OTHER): Payer: Self-pay | Admitting: Physician Assistant

## 2018-04-21 VITALS — BP 123/82 | HR 79 | Temp 98.1°F | Resp 16 | Ht 60.0 in | Wt 126.0 lb

## 2018-04-21 DIAGNOSIS — M79605 Pain in left leg: Secondary | ICD-10-CM

## 2018-04-21 DIAGNOSIS — R253 Fasciculation: Secondary | ICD-10-CM

## 2018-04-21 DIAGNOSIS — M79604 Pain in right leg: Secondary | ICD-10-CM

## 2018-04-21 DIAGNOSIS — Z131 Encounter for screening for diabetes mellitus: Secondary | ICD-10-CM

## 2018-04-21 DIAGNOSIS — E0590 Thyrotoxicosis, unspecified without thyrotoxic crisis or storm: Secondary | ICD-10-CM

## 2018-04-21 DIAGNOSIS — R202 Paresthesia of skin: Secondary | ICD-10-CM

## 2018-04-21 DIAGNOSIS — Z23 Encounter for immunization: Secondary | ICD-10-CM

## 2018-04-21 LAB — POCT GLYCOSYLATED HEMOGLOBIN (HGB A1C): HEMOGLOBIN A1C: 5.3 % (ref 4.0–5.6)

## 2018-04-21 MED ORDER — DULOXETINE HCL 30 MG PO CPEP: 30.0000 mg | ORAL_CAPSULE | Freq: Every day | ORAL | 3 refills | Status: DC

## 2018-04-21 NOTE — Patient Instructions (Signed)
Influenza Virus Vaccine injection Qu es este medicamento? La VACUNA CONTRA EL VIRUS DE LA INFLUENZA ayuda a reducir el riesgo de contraer la influenza, tambin conocida como gripe. La vacuna solo ayuda a protegerlo contra algunas cepas de la gripe. Este medicamento puede ser utilizado para otros usos; si tiene alguna pregunta consulte con su proveedor de atencin mdica o con su farmacutico. MARCAS COMUNES: Afluria, Agriflu, Alfuria, FLUAD, Fluarix, Fluarix Quadrivalent, Flublok, Flublok Quadrivalent, FLUCELVAX, Flulaval, Fluvirin, Fluzone, Fluzone High-Dose, Fluzone Intradermal Qu le debo informar a mi profesional de la salud antes de tomar este medicamento? Necesita saber si usted presenta alguno de los siguientes problemas o situaciones: -trastorno de sangrado como hemofilia -fiebre o infeccin -sndrome de Guillain-Barre u otros problemas neurolgicos -problemas del sistema inmunolgico -infeccin por el virus de la inmunodeficiencia humana (VIH) o SIDA -niveles bajos de plaquetas en la sangre -esclerosis mltiple -una reaccin alrgica o inusual a las vacunas antigripales, ltex, a otros medicamentos, alimentos, colorantes o conservantes. Distintas marcas de vacunas contienen [employer]. Algunas pueden contener ltex o huevos. Hable con su mdico acerca de sus alergias para asegurarse de que obtendr la vacuna adecuada para usted. -si est embarazada o buscando quedar embarazada -si est amamantando a un beb Cmo debo utilizar este medicamento? Esta vacuna se administra mediante inyeccin por va intramuscular o va subcutnea. Lo administra un profesional de [employer]. Recibir una copia de informacin escrita sobre la vacuna antes de cada vacuna. Asegrese de leer este folleto cada vez cuidadosamente. Este folleto puede cambiar con frecuencia. Hable con su proveedor de atencin medicamento para saber cules vacunas son adecuadas para usted. Algunas vacunas no se deben [employer] grupos de [place]. Sobredosis: Pngase en contacto inmediatamente con un centro toxicolgico o una sala de urgencia si usted cree que haya tomado demasiado medicamento. ATENCIN: [employer] es solo para usted. No comparta este medicamento con nadie. Qu sucede si me olvido de una dosis? No se aplica en este caso. Qu puede interactuar con este medicamento? -quimioterapia o radioterapia -medicamentos que suprimen el sistema inmunolgico, tales como etanercept, anakinra, infliximab y adalimumab -medicamentos que tratan o previenen cogulos sanguneos, como warfarina -fenitona -medicamentos esteroideos, como la prednisona o la cortisona -teofilina -vacunas Puede ser que esta lista no menciona todas las posibles interacciones. Informe a su profesional de [employer] de [employer] productos a base de hierbas, medicamentos de [place] o suplementos nutritivos que est tomando. Si usted fuma, consume bebidas alcohlicas o si utiliza drogas ilegales, indqueselo tambin a su profesional de [employer]. Algunas sustancias pueden interactuar con su medicamento. A qu debo estar atento al usar [employer]? Informe a su mdico o a Producer, television/film/video de la [employer] todos los efectos secundarios que persistan despus de [address]. Llame a su proveedor de atencin mdica si se presentan sntomas inusuales dentro de las 6 semanas posteriores a la vacunacin. Es posible que todava pueda contraer la gripe, pero la enfermedad no ser tan fuerte como normalmente. No puede contraer la gripe de esta vacuna. La vacuna antigripal no le protege contra resfros u otras enfermedades que pueden causar [place]. Debe vacunarse cada ao. Qu efectos secundarios puedo tener al [employer] este medicamento? Efectos secundarios que debe informar a su mdico o a Producer, television/film/video de la salud tan pronto como sea posible: -reacciones alrgicas como erupcin cutnea, picazn o urticarias, hinchazn de la cara, labios o  lengua Efectos secundarios que, por lo general, no requieren atencin mdica (debe informarlos a su mdico o  a su profesional de la salud si persisten o si son molestos): -fiebre -dolor de cabeza -molestias y dolores musculares -dolor, sensibilidad, enrojecimiento o Paramedichinchazn en el lugar de la inyeccin -cansancio Puede ser que esta lista no menciona todos los posibles efectos secundarios. Comunquese a su mdico por asesoramiento mdico Hewlett-Packardsobre los efectos secundarios. Usted puede informar los efectos secundarios a la FDA por telfono al 1-800-FDA-1088. Dnde debo guardar mi medicina? Esta Automotive engineervacunase le administrar un profesional de la salud en una Tehamaclnica, Rinconfarmacia, consultorio mdico u otro consultorio de un profesional de la salud. No se le suministrar esta vacuna para guardar en su domicilio. ATENCIN: Este folleto es un resumen. Puede ser que no cubra toda la posible informacin. Si usted tiene preguntas acerca de esta medicina, consulte con su mdico, su farmacutico o su profesional de Radiographer, therapeuticla salud.  2018 Elsevier/Gold Standard (2015-02-09 00:00:00)

## 2018-04-21 NOTE — Progress Notes (Signed)
Subjective:  Patient ID: Evelyn Price, female    DOB: [birth year]  Age: 37 y.o. MRN: [id number]  CC: bilateral leg pain  HPI Evelyn Price is a 37 y.o. female with a medical history of hyperthyroidism presents as a new patient with complaint of bilateral leg pain, back pain, headache, and muscular twitching of the calves, thighs, and hands since giving birth to her seven months ago. There is also assocated tingling of the legs, hands, and head. Has never had back pain or any of the other stated symptoms before in her life. Thinks maybe the epidural injection may have caused her symptoms. She read on the Internet that she may have Parkinson's disease. Pt considers herself a nervous person but thinks that her general anxiety subsided when she reached 37 years old. She is not sleeping well due to her infant. Denies breast feeding. Does not endorse any other symptoms.       Outpatient Medications Prior to Visit  Medication Sig Dispense Refill  . calcium carbonate (TUMS - DOSED IN MG ELEMENTAL CALCIUM) 500 MG chewable tablet Chew 1 tablet by mouth daily.    Marland Kitchen ibuprofen (ADVIL,MOTRIN) 600 MG tablet Take 1 tablet (600 mg total) by mouth every 6 (six) hours as needed. 30 tablet 0  . loratadine (CLARITIN) 10 MG tablet Take 10 mg by mouth daily.    . polyethylene glycol (MIRALAX / GLYCOLAX) packet Take 17 g by mouth daily as needed.    . Prenatal Vit-Fe Fumarate-FA (PRENATAL VITAMIN PO) Take by mouth.    . triamterene-hydrochlorothiazide (MAXZIDE-25) 37.5-25 MG tablet Take 1 tablet by mouth daily. 30 tablet 2   No facility-administered medications prior to visit.      ROS Review of Systems  Constitutional: Positive for malaise/fatigue. Negative for chills and fever.  Eyes: Negative for blurred vision.  Respiratory: Negative for shortness of breath.   Cardiovascular: Negative for chest pain and palpitations.  Gastrointestinal: Negative for abdominal pain and nausea.   Genitourinary: Negative for dysuria and hematuria.  Musculoskeletal: Positive for myalgias. Negative for joint pain.  Skin: Negative for rash.  Neurological: Positive for tingling and headaches.  Psychiatric/Behavioral: Negative for depression. The patient is not nervous/anxious.     Objective:  There were no vitals taken for this visit.  BP/Weight 10/03/2017 09/26/2017 09/23/2017  Systolic BP 136 134 144  Diastolic BP 93 98 95  Wt. (Lbs) 130.7 - 152.3  BMI 25.53 - 29.74      Physical Exam  Constitutional: She is oriented to person, place, and time.  Well developed, well nourished, NAD, polite  HENT:  Head: Normocephalic and atraumatic.  Eyes: No scleral icterus.  Neck: Normal range of motion. Neck supple. No thyromegaly present.  Cardiovascular: Normal rate, regular rhythm and normal heart sounds.  Pulmonary/Chest: Effort normal and breath sounds normal.  Abdominal: Soft. Bowel sounds are normal. There is no tenderness.  Musculoskeletal: She exhibits no edema.  Neurological: She is alert and oriented to person, place, and time.  Strength 5/5 throughout. Normal gait.   Skin: Skin is warm and dry. No rash noted. No erythema. No pallor.  Psychiatric: Her behavior is normal. Thought content normal.  Somewhat anxious  Vitals reviewed.    Assessment & Plan:   1. Muscle twitching - Comprehensive metabolic panel  2. Pain in both lower extremities - Sedimentation Rate - DULoxetine (CYMBALTA) 30 MG capsule; Take 1 capsule (30 mg total) by mouth daily.  Dispense: 30 capsule; Refill: 3  3. Paresthesia -  DULoxetine (CYMBALTA) 30 MG capsule; Take 1 capsule (30 mg total) by mouth daily.  Dispense: 30 capsule; Refill: 3  4. Hyperthyroidism - Thyroid Panel With TSH  5. Screening for diabetes mellitus - HgB A1c 5.3%  6. Need for prophylactic vaccination and inoculation against influenza - Flu Vaccine QUAD 6+ mos PF IM (Fluarix Quad PF)   Meds ordered this encounter   Medications  . DULoxetine (CYMBALTA) 30 MG capsule    Sig: Take 1 capsule (30 mg total) by mouth daily.    Dispense:  30 capsule    Refill:  3    Order Specific Question:   Supervising Provider    Answer:   Hoy RegisterNEWLIN, ENOBONG [[id number]]    Follow-up: Return in about 4 weeks (around 05/19/2018) for paresthesia, twitching, LE pain.   Loletta Specteroger David Gomez PA

## 2018-04-22 LAB — COMPREHENSIVE METABOLIC PANEL
A/G RATIO: 1.7 (ref 1.2–2.2)
ALBUMIN: 4.5 g/dL (ref 3.5–5.5)
ALT: 50 IU/L — AB (ref 0–32)
AST: 27 IU/L (ref 0–40)
Alkaline Phosphatase: 94 IU/L (ref 39–117)
BILIRUBIN TOTAL: 0.7 mg/dL (ref 0.0–1.2)
BUN / CREAT RATIO: 14 (ref 9–23)
BUN: 14 mg/dL (ref 6–20)
CHLORIDE: 103 mmol/L (ref 96–106)
CO2: 22 mmol/L (ref 20–29)
Calcium: 9.3 mg/dL (ref 8.7–10.2)
Creatinine, Ser: 1.03 mg/dL — ABNORMAL HIGH (ref 0.57–1.00)
GFR calc non Af Amer: 70 mL/min/{1.73_m2} (ref >59)
GFR, EST AFRICAN AMERICAN: 81 mL/min/{1.73_m2} (ref >59)
Globulin, Total: 2.6 g/dL (ref 1.5–4.5)
Glucose: 103 mg/dL — ABNORMAL HIGH (ref 65–99)
POTASSIUM: 3.8 mmol/L (ref 3.5–5.2)
Sodium: 138 mmol/L (ref 134–144)
TOTAL PROTEIN: 7.1 g/dL (ref 6.0–8.5)

## 2018-04-22 LAB — SEDIMENTATION RATE: Sed Rate: 14 mm/hr (ref 0–32)

## 2018-04-22 LAB — THYROID PANEL WITH TSH
FREE THYROXINE INDEX: 3.7 (ref 1.2–4.9)
T3 Uptake Ratio: 33 % (ref 24–39)
T4, Total: 11.2 ug/dL (ref 4.5–12.0)
TSH: 0.072 u[IU]/mL — ABNORMAL LOW (ref 0.450–4.500)

## 2018-04-24 ENCOUNTER — Telehealth (INDEPENDENT_AMBULATORY_CARE_PROVIDER_SITE_OTHER): Payer: Self-pay

## 2018-04-24 NOTE — Telephone Encounter (Signed)
-----   Message from Loletta Specteroger David Gomez, PA-C sent at 04/22/2018  2:04 PM EST ----- Normal thyroid hormone level. Recheck in 8 weeks. Mild liver inflammation, make sure to eat a low fat diet and recheck in six months.

## 2018-04-24 NOTE — Telephone Encounter (Signed)
Call placed using pacific interpreter [phone]([phone]) left voicemail notifying patient that her thyroid hormone level is normal and we will recheck in eight weeks. Mild liver inflammation, recommended to eat a low fat diet and recheck in six months. Return call to [facility] at [phone] with any questions or concerns. Maryjean Mornempestt S Emme Rosenau, CMA

## 2018-05-19 ENCOUNTER — Ambulatory Visit (INDEPENDENT_AMBULATORY_CARE_PROVIDER_SITE_OTHER): Payer: Self-pay | Admitting: Physician Assistant

## 2019-09-14 ENCOUNTER — Encounter: Payer: Self-pay | Admitting: Nurse Practitioner

## 2019-09-14 ENCOUNTER — Ambulatory Visit: Payer: Self-pay | Attending: Nurse Practitioner | Admitting: Nurse Practitioner

## 2019-09-14 ENCOUNTER — Other Ambulatory Visit: Payer: Self-pay

## 2019-09-14 DIAGNOSIS — Z13228 Encounter for screening for other metabolic disorders: Secondary | ICD-10-CM

## 2019-09-14 DIAGNOSIS — Z131 Encounter for screening for diabetes mellitus: Secondary | ICD-10-CM

## 2019-09-14 DIAGNOSIS — Z130 Encounter for screening for diseases of the blood and blood-forming organs and certain disorders involving the immune mechanism: Secondary | ICD-10-CM

## 2019-09-14 DIAGNOSIS — Z7689 Persons encountering health services in other specified circumstances: Secondary | ICD-10-CM

## 2019-09-14 DIAGNOSIS — E785 Hyperlipidemia, unspecified: Secondary | ICD-10-CM

## 2019-09-14 NOTE — Progress Notes (Signed)
Virtual Visit via Telephone Note Due to national recommendations of social distancing due to [place] 19, telehealth visit is felt to be most appropriate for this patient at this time.  I discussed the limitations, risks, security and privacy concerns of performing an evaluation and management service by telephone and the availability of in person appointments. I also discussed with the patient that there may be a patient responsible charge related to this service. The patient expressed understanding and agreed to proceed.    I connected with Evelyn Price on 09/14/19  at   1:50 PM EDT  EDT by telephone and verified that I am speaking with the correct person using two identifiers.   Consent I discussed the limitations, risks, security and privacy concerns of performing an evaluation and management service by telephone and the availability of in person appointments. I also discussed with the patient that there may be a patient responsible charge related to this service. The patient expressed understanding and agreed to proceed.   Location of Patient: Private Residence   Location of Provider: [place] [facility] [employer] Office    Persons participating in Telemedicine visit: Geryl Rankins FNP-BC [place]  Spanish interpreter Roldolfo ID# [id number]  History of Present Illness: Telemedicine visit for: Establish Care  has a past medical history of Allergy and Hyperthyroidism.   Health Maintenance PAP 2018  Thyroid Disease She has a history of Graves Disease. Seeing [facility] Endocrinologist Dr. Ladonna Snide. Last visit a few months ago.   Taking propranolol for palpitations/tachycardia related to hyperthyroidism.  07-06-2019 TSH 0.635 T3 Total 134 Free T4 0.9    Denies any history of DM, HPL, or HTN.  Past Medical History:  Diagnosis Date  . Allergy    seasonal  . Hyperthyroidism     Past Surgical History:  Procedure Laterality Date  .  CESAREAN SECTION      History reviewed. No pertinent family history.  Social History   Socioeconomic History  . Marital status: Married    Spouse name: N/A  . Number of children: 1  . Years of education: 6  . Highest education level: Not on file  Occupational History  . Occupation: cleaning  Tobacco Use  . Smoking status: Never Smoker  . Smokeless tobacco: Never Used  Substance and Sexual Activity  . Alcohol use: No    Alcohol/week: 0.0 standard drinks  . Drug use: No  . Sexual activity: Yes    Partners: Male    Birth control/protection: Condom, None  Other Topics Concern  . Not on file  Social History Narrative   From [place], [place]. Came to the [place] in 1999   Marital status: single.  Dating same boyfriend x 1 year.     Children: one son     Living with: son     Employment: cleaning      Tobacco: none      Alcohol: none      Drugs: none   Social Determinants of Health   Financial Resource Strain:   . Difficulty of Paying Living Expenses:   Food Insecurity:   . Worried About Charity fundraiser in the Last Year:   . Arboriculturist in the Last Year:   Transportation Needs:   . Film/video editor (Medical):   Marland Kitchen Lack of Transportation (Non-Medical):   Physical Activity:   . Days of Exercise per Week:   . Minutes of Exercise per Session:   Stress:   . Feeling  of Stress :   Social Connections:   . Frequency of Communication with Friends and Family:   . Frequency of Social Gatherings with Friends and Family:   . Attends Religious Services:   . Active Member of Clubs or Organizations:   . Attends Archivist Meetings:   Marland Kitchen Marital Status:      Observations/Objective: Awake, alert and oriented x 3   Review of Systems  Constitutional: Negative for fever, malaise/fatigue and weight loss.  HENT: Negative.  Negative for nosebleeds.   Eyes: Negative.  Negative for blurred vision, double vision and photophobia.  Respiratory: Negative.  Negative for  cough and shortness of breath.   Cardiovascular: Negative.  Negative for chest pain, palpitations and leg swelling.  Gastrointestinal: Negative.  Negative for heartburn, nausea and vomiting.  Musculoskeletal: Negative.  Negative for myalgias.  Skin:       Left abominal mass- 3 years.   Neurological: Negative.  Negative for dizziness, focal weakness, seizures and headaches.  Endo/Heme/Allergies: Positive for environmental allergies.  Psychiatric/Behavioral: Negative.  Negative for suicidal ideas.    Assessment and Plan: Evelyn Price was seen today for establish care.  Diagnoses and all orders for this visit:  Encounter to establish care  Dyslipidemia -     Lipid panel; Future  Screening for metabolic disorder -     JXF36+VQOH; Future  Screening for deficiency anemia -     CBC; Future  Encounter for screening for diabetes mellitus -     Hemoglobin A1c; Future     Follow Up Instructions Return for PAP; abdominal exam.     I discussed the assessment and treatment plan with the patient. The patient was provided an opportunity to ask questions and all were answered. The patient agreed with the plan and demonstrated an understanding of the instructions.   The patient was advised to call back or seek an in-person evaluation if the symptoms worsen or if the condition fails to improve as anticipated.  I provided 15 minutes of non-face-to-face time during this encounter including median intraservice time, reviewing previous notes, labs, imaging, medications and explaining diagnosis and management.  Gildardo Pounds, FNP-BC

## 2019-09-20 ENCOUNTER — Ambulatory Visit: Payer: Self-pay | Attending: Nurse Practitioner

## 2019-09-20 ENCOUNTER — Other Ambulatory Visit: Payer: Self-pay

## 2019-09-20 DIAGNOSIS — Z130 Encounter for screening for diseases of the blood and blood-forming organs and certain disorders involving the immune mechanism: Secondary | ICD-10-CM

## 2019-09-20 DIAGNOSIS — Z131 Encounter for screening for diabetes mellitus: Secondary | ICD-10-CM

## 2019-09-20 DIAGNOSIS — E785 Hyperlipidemia, unspecified: Secondary | ICD-10-CM

## 2019-09-20 DIAGNOSIS — Z13228 Encounter for screening for other metabolic disorders: Secondary | ICD-10-CM

## 2019-09-21 ENCOUNTER — Other Ambulatory Visit: Payer: Self-pay

## 2019-09-21 LAB — HEMOGLOBIN A1C
Est. average glucose Bld gHb Est-mCnc: 114 mg/dL
Hgb A1c MFr Bld: 5.6 % (ref 4.8–5.6)

## 2019-09-21 LAB — CMP14+EGFR
ALT: 32 IU/L (ref 0–32)
AST: 19 IU/L (ref 0–40)
Albumin/Globulin Ratio: 1.7 (ref 1.2–2.2)
Albumin: 4.3 g/dL (ref 3.8–4.8)
Alkaline Phosphatase: 96 IU/L (ref 39–117)
BUN/Creatinine Ratio: 24 — ABNORMAL HIGH (ref 9–23)
BUN: 21 mg/dL — ABNORMAL HIGH (ref 6–20)
Bilirubin Total: 0.3 mg/dL (ref 0.0–1.2)
CO2: 22 mmol/L (ref 20–29)
Calcium: 8.7 mg/dL (ref 8.7–10.2)
Chloride: 105 mmol/L (ref 96–106)
Creatinine, Ser: 0.86 mg/dL (ref 0.57–1.00)
GFR calc Af Amer: 100 mL/min/{1.73_m2} (ref >59)
GFR calc non Af Amer: 87 mL/min/{1.73_m2} (ref >59)
Globulin, Total: 2.6 g/dL (ref 1.5–4.5)
Glucose: 106 mg/dL — ABNORMAL HIGH (ref 65–99)
Potassium: 3.9 mmol/L (ref 3.5–5.2)
Sodium: 139 mmol/L (ref 134–144)
Total Protein: 6.9 g/dL (ref 6.0–8.5)

## 2019-09-21 LAB — LIPID PANEL
Chol/HDL Ratio: 2.6 ratio (ref 0.0–4.4)
Cholesterol, Total: 185 mg/dL (ref 100–199)
HDL: 72 mg/dL (ref >39)
LDL Chol Calc (NIH): 91 mg/dL (ref 0–99)
Triglycerides: 128 mg/dL (ref 0–149)
VLDL Cholesterol Cal: 22 mg/dL (ref 5–40)

## 2019-09-21 LAB — CBC
Hematocrit: 38.6 % (ref 34.0–46.6)
Hemoglobin: 13.2 g/dL (ref 11.1–15.9)
MCH: 31.6 pg (ref 26.6–33.0)
MCHC: 34.2 g/dL (ref 31.5–35.7)
MCV: 92 fL (ref 79–97)
Platelets: 279 10*3/uL (ref 150–450)
RBC: 4.18 x10E6/uL (ref 3.77–5.28)
RDW: 12.7 % (ref 11.7–15.4)
WBC: 11 10*3/uL — ABNORMAL HIGH (ref 3.4–10.8)

## 2019-11-08 ENCOUNTER — Other Ambulatory Visit: Payer: Self-pay

## 2019-11-08 ENCOUNTER — Ambulatory Visit: Payer: Self-pay | Attending: Nurse Practitioner | Admitting: Nurse Practitioner

## 2019-11-08 ENCOUNTER — Encounter: Payer: Self-pay | Admitting: Nurse Practitioner

## 2019-11-08 VITALS — BP 122/78 | HR 78 | Temp 97.7°F | Ht 60.0 in | Wt 128.0 lb

## 2019-11-08 DIAGNOSIS — Z124 Encounter for screening for malignant neoplasm of cervix: Secondary | ICD-10-CM

## 2019-11-08 DIAGNOSIS — R1900 Intra-abdominal and pelvic swelling, mass and lump, unspecified site: Secondary | ICD-10-CM

## 2019-11-08 NOTE — Progress Notes (Signed)
Assessment & Plan:  Evelyn Price was seen today for gynecologic exam.  Diagnoses and all orders for this visit:  Encounter for Papanicolaou smear for cervical cancer screening -     Cytology - PAP -     Cervicovaginal ancillary only  Pelvic mass -     US Abdomen Limited; Future   Patient has been counseled on age-appropriate routine health concerns for screening and prevention. These are reviewed and up-to-date. Referrals have been placed accordingly. Immunizations are up-to-date or declined.    Subjective:   Chief Complaint  Patient presents with  . Gynecologic Exam    Pt. is here for a pap smear exam.    HPI Evelyn Price 39 y.o. female presents to office today for PAP  Endorses a left lower pelvic mass more pronounced when she stands up. Is reproducible per her report. I am unable to appreciate any palpable mass here in the office today with her lying, sitting and standing.    Review of Systems  Constitutional: Negative.  Negative for chills, fever, malaise/fatigue and weight loss.  Respiratory: Negative.  Negative for cough, shortness of breath and wheezing.   Cardiovascular: Negative.  Negative for chest pain, orthopnea and leg swelling.  Gastrointestinal: Negative for abdominal pain.       Mass  Genitourinary: Negative.  Negative for flank pain.  Skin: Negative.  Negative for rash.  Psychiatric/Behavioral: Negative for suicidal ideas.    Past Medical History:  Diagnosis Date  . Allergy    seasonal  . Hyperthyroidism     Past Surgical History:  Procedure Laterality Date  . CESAREAN SECTION      History reviewed. No pertinent family history.  Social History Reviewed with no changes to be made today.   Outpatient Medications Prior to Visit  Medication Sig Dispense Refill  . propranolol (INDERAL) 10 MG tablet Take 10 mg by mouth daily.     No facility-administered medications prior to visit.    No Known Allergies     Objective:    BP  122/78 (BP Location: Left Arm, Patient Position: Sitting, Cuff Size: Normal)   Pulse 78   Temp 97.7 F (36.5 C) (Temporal)   Ht 5' (1.524 m)   Wt 128 lb (58.1 kg)   LMP 10/17/2019   SpO2 99%   BMI 25.00 kg/m  Wt Readings from Last 3 Encounters:  11/08/19 128 lb (58.1 kg)  04/21/18 126 lb (57.2 kg)  10/03/17 130 lb 11.2 oz (59.3 kg)    Physical Exam Exam conducted with a chaperone present.  Constitutional:      Appearance: She is well-developed.  HENT:     Head: Normocephalic.  Cardiovascular:     Rate and Rhythm: Normal rate and regular rhythm.     Heart sounds: Normal heart sounds.  Pulmonary:     Effort: Pulmonary effort is normal.     Breath sounds: Normal breath sounds.  Abdominal:     General: Bowel sounds are normal.     Palpations: Abdomen is soft.     Tenderness: There is no abdominal tenderness.     Hernia: There is no hernia in the umbilical area, ventral area, left inguinal area, right femoral area, left femoral area or right inguinal area.    Genitourinary:    Exam position: Lithotomy position.     Labia:        Right: No rash, tenderness, lesion or injury.        Left: No rash, tenderness, lesion  or injury.      Vagina: Normal. No signs of injury and foreign body. No vaginal discharge, erythema, tenderness or bleeding.     Cervix: Cervical bleeding present. No cervical motion tenderness or friability.     Uterus: Not deviated and not enlarged.      Adnexa:        Right: No mass, tenderness or fullness.         Left: No mass, tenderness or fullness.       Rectum: Normal. No external hemorrhoid.  Lymphadenopathy:     Lower Body: No right inguinal adenopathy. No left inguinal adenopathy.  Skin:    General: Skin is warm and dry.  Neurological:     Mental Status: She is alert and oriented to person, place, and time.  Psychiatric:        Behavior: Behavior normal.        Thought Content: Thought content normal.        Judgment: Judgment normal.           Patient has been counseled extensively about nutrition and exercise as well as the importance of adherence with medications and regular follow-up. The patient was given clear instructions to go to ER or return to medical center if symptoms don't improve, worsen or new problems develop. The patient verbalized understanding.   Follow-up: Return if symptoms worsen or fail to improve.   Claiborne Rigg, FNP-BC [facility] [place], [place] [phone]   11/08/2019, 2:55 PM

## 2019-11-09 LAB — CERVICOVAGINAL ANCILLARY ONLY
Bacterial Vaginitis (gardnerella): NEGATIVE
Candida Glabrata: NEGATIVE
Candida Vaginitis: NEGATIVE
Chlamydia: NEGATIVE
Comment: NEGATIVE
Comment: NEGATIVE
Comment: NEGATIVE
Comment: NEGATIVE
Comment: NEGATIVE
Comment: NORMAL
Neisseria Gonorrhea: NEGATIVE
Trichomonas: NEGATIVE

## 2019-11-10 LAB — CYTOLOGY - PAP
Comment: NEGATIVE
Diagnosis: NEGATIVE
High risk HPV: NEGATIVE

## 2022-07-10 ENCOUNTER — Ambulatory Visit: Payer: Self-pay

## 2022-07-10 NOTE — Telephone Encounter (Signed)
  Chief Complaint: itching Symptoms: frequent itching, site become red after scratching Frequency: yesterday  Pertinent Negatives: Patient denies rash, fever Disposition: [] ED /[] Urgent Care (no appt availability in office) / [] Appointment(In office/virtual)/ []  [facility]/ [] Home Care/ [] Refused Recommended Disposition /[x]  [facility]/ []  Follow-up with PCP Additional Notes: itching to arms, back, sides of legs head  Reason for Disposition  [1] MODERATE-SEVERE widespread itching (i.e., interferes with sleep, normal activities or school) AND [2] not improved after 24 hours of itching Care Advice  Answer Assessment - Initial Assessment Questions 1. DESCRIPTION: "Describe the itching you are having."     Itching - arms, back, legs sides and head  2. SEVERITY: "How bad is it?"    - MILD: Doesn't interfere with normal activities.   - MODERATE-SEVERE: Interferes with work, school, sleep, or other activities.      Moderate to severe 3. SCRATCHING: "Are there any scratch marks? Bleeding?"     No just redness where scratching 4. ONSET: "When did this begin?"      Yesterday  5. CAUSE: "What do you think is causing the itching?" (ask about swimming pools, pollen, animals, soaps, etc.)     Recent URI with fever cough took a lot of Tylenol took  2 every 4 hours in past 24 hours 6. OTHER SYMPTOMS: "Do you have any other symptoms?"      Red after scratching and warm  7. PREGNANCY: "Is there any chance you are pregnant?" "When was your last menstrual period?"     N/a  Protocols used: Itching - [facility]

## 2022-09-02 ENCOUNTER — Ambulatory Visit: Payer: Self-pay | Attending: Nurse Practitioner | Admitting: Nurse Practitioner

## 2022-09-23 ENCOUNTER — Encounter: Payer: Self-pay | Admitting: Nurse Practitioner

## 2022-09-23 ENCOUNTER — Ambulatory Visit: Payer: Self-pay | Attending: Nurse Practitioner | Admitting: Nurse Practitioner

## 2022-09-23 ENCOUNTER — Other Ambulatory Visit: Payer: Self-pay

## 2022-09-23 VITALS — BP 127/75 | HR 86 | Ht 60.0 in | Wt 134.0 lb

## 2022-09-23 DIAGNOSIS — Z1231 Encounter for screening mammogram for malignant neoplasm of breast: Secondary | ICD-10-CM

## 2022-09-23 DIAGNOSIS — R053 Chronic cough: Secondary | ICD-10-CM

## 2022-09-23 DIAGNOSIS — R251 Tremor, unspecified: Secondary | ICD-10-CM

## 2022-09-23 DIAGNOSIS — R7309 Other abnormal glucose: Secondary | ICD-10-CM

## 2022-09-23 DIAGNOSIS — Z7689 Persons encountering health services in other specified circumstances: Secondary | ICD-10-CM

## 2022-09-23 MED ORDER — FLUTICASONE PROPIONATE 50 MCG/ACT NA SUSP: 2.0000 | Freq: Every day | NASAL | 1 refills | Status: DC

## 2022-09-23 MED ORDER — LORATADINE 10 MG PO TABS: 10.0000 mg | ORAL_TABLET | Freq: Every day | ORAL | 11 refills | Status: DC

## 2022-09-23 NOTE — Progress Notes (Signed)
Assessment & Plan:  Evelyn Price was seen today for establish care.  Diagnoses and all orders for this visit:  Encounter to establish care  Breast cancer screening by mammogram -     MS 3D SCR MAMMO BILAT BR (aka MM); Future  Persistent cough -     loratadine (CLARITIN) 10 MG tablet; Take 1 tablet (10 mg total) by mouth daily. -     fluticasone (FLONASE) 50 MCG/ACT nasal spray; Place 2 sprays into both nostrils daily. -     CMP14+EGFR -     CBC with Differential Also recommended to avoid possible GERD triggers  Occasional tremors -     CMP14+EGFR -     Thyroid Panel With TSH  Elevated glucose -     Hemoglobin A1c    Patient has been counseled on age-appropriate routine health concerns for screening and prevention. These are reviewed and up-to-date. Referrals have been placed accordingly. Immunizations are up-to-date or declined.    Subjective:   Chief Complaint  Patient presents with   Establish Care   HPI Evelyn Price 42 y.o. female presents to office today to re establish care.  VRI was used to communicate directly with patient for the entire encounter including providing detailed patient instructions.    She is being followed by endocrinology of [facility] every 6 months initially and now annually. She is concerned her thyroid levels are abnormal as she endorses intermittent fine tremors all over her body. No visible tremors today via exam.   Patient has been counseled on age-appropriate routine health concerns for screening and prevention. These are reviewed and up-to-date. Referrals have been placed accordingly. Immunizations are up-to-date or declined.   MAMMOGRAM: OVERDUE. Referral placed today PAP SMEAR:  UTD. Due in May  She was diagnosed with the FLU a few months ago. Still with intermittent dry cough however she does also have a history of seasonal allergies. Associated symptoms include intermittent sore throat. Feels the pain alternates from one  side to the other in her throat.    She has a few moles and skin tags she is concerned about today. They have not increased in size or changed shapes.    Review of Systems  Constitutional:  Negative for fever, malaise/fatigue and weight loss.  HENT: Negative.  Negative for nosebleeds.   Eyes: Negative.  Negative for blurred vision, double vision and photophobia.  Respiratory:  Positive for cough. Negative for shortness of breath.   Cardiovascular: Negative.  Negative for chest pain, palpitations and leg swelling.  Gastrointestinal: Negative.  Negative for heartburn, nausea and vomiting.  Musculoskeletal: Negative.  Negative for myalgias.  Neurological:  Positive for tremors. Negative for dizziness, tingling, focal weakness, seizures and headaches.  Endo/Heme/Allergies:  Positive for environmental allergies.  Psychiatric/Behavioral: Negative.  Negative for suicidal ideas.     Past Medical History:  Diagnosis Date   Allergy    seasonal   Hyperthyroidism     Past Surgical History:  Procedure Laterality Date   CESAREAN SECTION      History reviewed. No pertinent family history.  Social History Reviewed with no changes to be made today.   Outpatient Medications Prior to Visit  Medication Sig Dispense Refill   propranolol (INDERAL) 10 MG tablet Take 10 mg by mouth daily.     No facility-administered medications prior to visit.    Allergies  Allergen Reactions   Other Other (See Comments)    Seasonal       Objective:  BP 127/75   Pulse 86   Ht 5' (1.524 m)   Wt 134 lb (60.8 kg)   LMP 08/25/2022 (Approximate)   SpO2 100%   BMI 26.17 kg/m  Wt Readings from Last 3 Encounters:  09/23/22 134 lb (60.8 kg)  11/08/19 128 lb (58.1 kg)  04/21/18 126 lb (57.2 kg)    Physical Exam Vitals and nursing note reviewed.  Constitutional:      Appearance: She is well-developed.  HENT:     Head: Normocephalic and atraumatic.  Cardiovascular:     Rate and Rhythm: Normal  rate and regular rhythm.     Heart sounds: Normal heart sounds. No murmur heard.    No friction rub. No gallop.  Pulmonary:     Effort: Pulmonary effort is normal. No tachypnea or respiratory distress.     Breath sounds: Normal breath sounds. No decreased breath sounds, wheezing, rhonchi or rales.  Chest:     Chest wall: No tenderness.  Abdominal:     General: Bowel sounds are normal.     Palpations: Abdomen is soft.  Musculoskeletal:        General: Normal range of motion.     Cervical back: Normal range of motion.  Skin:    General: Skin is warm and dry.  Neurological:     Mental Status: She is alert and oriented to person, place, and time.     Coordination: Coordination normal.  Psychiatric:        Behavior: Behavior normal. Behavior is cooperative.        Thought Content: Thought content normal.        Judgment: Judgment normal.          Patient has been counseled extensively about nutrition and exercise as well as the importance of adherence with medications and regular follow-up. The patient was given clear instructions to go to ER or return to medical center if symptoms don't improve, worsen or new problems develop. The patient verbalized understanding.   Follow-up: Return in about 5 weeks (around 10/28/2022) for 5-6 weeks PAP and cough. Claiborne Rigg, FNP-BC [facility] [place], [place] [phone]   09/23/2022, 2:23 PM

## 2022-09-24 LAB — CBC WITH DIFFERENTIAL/PLATELET
Basophils Absolute: 0 10*3/uL (ref 0.0–0.2)
Basos: 0 %
EOS (ABSOLUTE): 0.2 10*3/uL (ref 0.0–0.4)
Eos: 3 %
Hematocrit: 42.9 % (ref 34.0–46.6)
Hemoglobin: 14.4 g/dL (ref 11.1–15.9)
Immature Grans (Abs): 0 10*3/uL (ref 0.0–0.1)
Immature Granulocytes: 0 %
Lymphocytes Absolute: 2.7 10*3/uL (ref 0.7–3.1)
Lymphs: 34 %
MCH: 30.1 pg (ref 26.6–33.0)
MCHC: 33.6 g/dL (ref 31.5–35.7)
MCV: 90 fL (ref 79–97)
Monocytes Absolute: 0.6 10*3/uL (ref 0.1–0.9)
Monocytes: 7 %
Neutrophils Absolute: 4.3 10*3/uL (ref 1.4–7.0)
Neutrophils: 56 %
Platelets: 298 10*3/uL (ref 150–450)
RBC: 4.78 x10E6/uL (ref 3.77–5.28)
RDW: 13.7 % (ref 11.7–15.4)
WBC: 7.7 10*3/uL (ref 3.4–10.8)

## 2022-09-24 LAB — CMP14+EGFR
ALT: 18 IU/L (ref 0–32)
AST: 16 IU/L (ref 0–40)
Albumin/Globulin Ratio: 1.6 (ref 1.2–2.2)
Albumin: 4.4 g/dL (ref 3.9–4.9)
Alkaline Phosphatase: 96 IU/L (ref 44–121)
BUN/Creatinine Ratio: 24 — ABNORMAL HIGH (ref 9–23)
BUN: 18 mg/dL (ref 6–24)
Bilirubin Total: 0.5 mg/dL (ref 0.0–1.2)
CO2: 20 mmol/L (ref 20–29)
Calcium: 9.3 mg/dL (ref 8.7–10.2)
Chloride: 101 mmol/L (ref 96–106)
Creatinine, Ser: 0.74 mg/dL (ref 0.57–1.00)
Globulin, Total: 2.7 g/dL (ref 1.5–4.5)
Glucose: 99 mg/dL (ref 70–99)
Potassium: 4.3 mmol/L (ref 3.5–5.2)
Sodium: 138 mmol/L (ref 134–144)
Total Protein: 7.1 g/dL (ref 6.0–8.5)
eGFR: 105 mL/min/{1.73_m2} (ref >59)

## 2022-09-24 LAB — HEMOGLOBIN A1C
Est. average glucose Bld gHb Est-mCnc: 128 mg/dL
Hgb A1c MFr Bld: 6.1 % — ABNORMAL HIGH (ref 4.8–5.6)

## 2022-09-24 LAB — THYROID PANEL WITH TSH
Free Thyroxine Index: 1.9 (ref 1.2–4.9)
T3 Uptake Ratio: 24 % (ref 24–39)
T4, Total: 8.1 ug/dL (ref 4.5–12.0)
TSH: 1.09 u[IU]/mL (ref 0.450–4.500)

## 2022-10-14 ENCOUNTER — Telehealth: Payer: Self-pay

## 2022-10-14 NOTE — Telephone Encounter (Signed)
Telephoned patient at mobile number. Left a voice message with [facility] contact information. 

## 2022-10-24 ENCOUNTER — Ambulatory Visit
Admission: RE | Admit: 2022-10-24 | Discharge: 2022-10-24 | Disposition: A | Discharge status: Home / Self Care | Payer: No Typology Code available for payment source | Source: Ambulatory Visit | Attending: Nurse Practitioner | Admitting: Nurse Practitioner

## 2022-10-24 DIAGNOSIS — Z1231 Encounter for screening mammogram for malignant neoplasm of breast: Secondary | ICD-10-CM

## 2022-11-04 ENCOUNTER — Ambulatory Visit: Payer: Self-pay | Attending: Nurse Practitioner | Admitting: Nurse Practitioner

## 2022-11-04 ENCOUNTER — Encounter: Payer: Self-pay | Admitting: Nurse Practitioner

## 2022-11-04 ENCOUNTER — Other Ambulatory Visit (HOSPITAL_COMMUNITY)
Admission: RE | Admit: 2022-11-04 | Discharge: 2022-11-04 | Disposition: A | Discharge status: Home / Self Care | Payer: Self-pay | Source: Ambulatory Visit | Attending: Nurse Practitioner | Admitting: Nurse Practitioner

## 2022-11-04 VITALS — BP 134/87 | HR 71 | Wt 132.0 lb

## 2022-11-04 DIAGNOSIS — Z124 Encounter for screening for malignant neoplasm of cervix: Secondary | ICD-10-CM | POA: Insufficient documentation

## 2022-11-04 DIAGNOSIS — J302 Other seasonal allergic rhinitis: Secondary | ICD-10-CM

## 2022-11-04 MED ORDER — LORATADINE 10 MG PO TABS
10.0000 mg | ORAL_TABLET | Freq: Every day | ORAL | 11 refills | Status: AC
Start: 2022-11-04 — End: ?

## 2022-11-04 MED ORDER — FLUTICASONE PROPIONATE 50 MCG/ACT NA SUSP
2.0000 | Freq: Every day | NASAL | 1 refills | Status: AC
Start: 2022-11-04 — End: ?

## 2022-11-04 NOTE — Progress Notes (Signed)
Assessment & Plan:  Evelyn Price was seen today for gynecologic exam.  Diagnoses and all orders for this visit:  Encounter for Papanicolaou smear for cervical cancer screening -     Cervicovaginal ancillary only -     Cytology - PAP  Seasonal allergies -     fluticasone (FLONASE) 50 MCG/ACT nasal spray; Place 2 sprays into both nostrils daily. -     loratadine (CLARITIN) 10 MG tablet; Take 1 tablet (10 mg total) by mouth daily.    Patient has been counseled on age-appropriate routine health concerns for screening and prevention. These are reviewed and up-to-date. Referrals have been placed accordingly. Immunizations are up-to-date or declined.    Subjective:   Chief Complaint  Patient presents with   Gynecologic Exam   HPI Evelyn Price 42 y.o. female presents to office today for well woman exam. Mammogram is UTD.  VRI was used to communicate directly with patient for the entire encounter including providing detailed patient instructions.    Patient has been counseled on age-appropriate routine health concerns for screening and prevention. These are reviewed and up-to-date. Referrals have been placed accordingly. Immunizations are up-to-date or declined.     MAMMOGRAM: OVERDUE. Referral placed today PAP SMEAR: OVERDUE. Completed today.   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 Feels pain sometimes goes to her head and ears and causes headache and ear pressure. She has not been taking her claritin.   BP Readings from Last 3 Encounters:  11/04/22 134/87  09/23/22 127/75  11/08/19 122/78     ROS  Past Medical History:  Diagnosis Date   Allergy    seasonal   Hyperthyroidism     Past Surgical History:  Procedure Laterality Date   CESAREAN SECTION      Family History  Problem Relation Age of Onset   Breast cancer Neg Hx     Social History Reviewed with no changes to be made today.   Outpatient  Medications Prior to Visit  Medication Sig Dispense Refill   loratadine (CLARITIN) 10 MG tablet Take 1 tablet (10 mg total) by mouth daily. 30 tablet 11   fluticasone (FLONASE) 50 MCG/ACT nasal spray Place 2 sprays into both nostrils daily. (Patient not taking: Reported on 11/04/2022) 16 g 1   propranolol (INDERAL) 10 MG tablet Take 10 mg by mouth daily. (Patient not taking: Reported on 11/04/2022)     No facility-administered medications prior to visit.    Allergies  Allergen Reactions   Other Other (See Comments)    Seasonal       Objective:    BP 134/87 (BP Location: Left Arm, Patient Position: Sitting, Cuff Size: Normal)   Pulse 71   Wt 132 lb (59.9 kg)   LMP 10/03/2022 (Approximate)   SpO2 99%   BMI 25.78 kg/m  Wt Readings from Last 3 Encounters:  11/04/22 132 lb (59.9 kg)  09/23/22 134 lb (60.8 kg)  11/08/19 128 lb (58.1 kg)    Physical Exam       Patient has been counseled extensively about nutrition and exercise as well as the importance of adherence with medications and regular follow-up. The patient was given clear instructions to go to ER or return to medical center if symptoms don't improve, worsen or new problems develop. The patient verbalized understanding.   Follow-up: Return if symptoms worsen or fail to improve.   Claiborne Rigg, FNP-BC [facility]  [place], [place] [phone]   11/04/2022, 2:21 PM

## 2022-11-05 LAB — CERVICOVAGINAL ANCILLARY ONLY
Bacterial Vaginitis (gardnerella): NEGATIVE
Candida Glabrata: NEGATIVE
Candida Vaginitis: NEGATIVE
Chlamydia: NEGATIVE
Comment: NEGATIVE
Comment: NEGATIVE
Comment: NEGATIVE
Comment: NEGATIVE
Comment: NEGATIVE
Comment: NORMAL
Neisseria Gonorrhea: NEGATIVE
Trichomonas: NEGATIVE

## 2022-11-07 LAB — CYTOLOGY - PAP
Comment: NEGATIVE
Diagnosis: NEGATIVE
High risk HPV: NEGATIVE

## 2023-02-19 ENCOUNTER — Ambulatory Visit: Payer: Self-pay | Attending: Physician Assistant | Admitting: Physician Assistant

## 2023-02-19 ENCOUNTER — Encounter: Payer: Self-pay | Admitting: Physician Assistant

## 2023-02-19 VITALS — BP 133/89 | HR 79 | Wt 131.8 lb

## 2023-02-19 DIAGNOSIS — Z758 Other problems related to medical facilities and other health care: Secondary | ICD-10-CM

## 2023-02-19 DIAGNOSIS — Z603 Acculturation difficulty: Secondary | ICD-10-CM

## 2023-02-19 DIAGNOSIS — R1012 Left upper quadrant pain: Secondary | ICD-10-CM

## 2023-02-19 DIAGNOSIS — R519 Headache, unspecified: Secondary | ICD-10-CM

## 2023-02-19 DIAGNOSIS — H04123 Dry eye syndrome of bilateral lacrimal glands: Secondary | ICD-10-CM

## 2023-02-19 DIAGNOSIS — R1013 Epigastric pain: Secondary | ICD-10-CM

## 2023-02-19 DIAGNOSIS — R202 Paresthesia of skin: Secondary | ICD-10-CM

## 2023-02-19 DIAGNOSIS — Z8639 Personal history of other endocrine, nutritional and metabolic disease: Secondary | ICD-10-CM

## 2023-02-19 LAB — POCT URINALYSIS DIP (CLINITEK)
Bilirubin, UA: NEGATIVE
Blood, UA: NEGATIVE
Glucose, UA: NEGATIVE mg/dL
Ketones, POC UA: NEGATIVE mg/dL
Leukocytes, UA: NEGATIVE
Nitrite, UA: NEGATIVE
POC PROTEIN,UA: NEGATIVE
Spec Grav, UA: 1.02 (ref 1.010–1.025)
Urobilinogen, UA: 0.2 U/dL
pH, UA: 7 (ref 5.0–8.0)

## 2023-02-19 LAB — POCT URINE PREGNANCY: Preg Test, Ur: NEGATIVE

## 2023-02-19 MED ORDER — OMEPRAZOLE 20 MG PO CPDR
20.0000 mg | DELAYED_RELEASE_CAPSULE | Freq: Every day | ORAL | 3 refills | Status: AC
Start: 2023-02-19 — End: ?

## 2023-02-19 MED ORDER — AZELASTINE HCL 0.05 % OP SOLN
1.0000 [drp] | Freq: Two times a day (BID) | OPHTHALMIC | 12 refills | Status: AC
Start: 2023-02-19 — End: ?

## 2023-02-19 NOTE — Progress Notes (Signed)
Patient ID: Evelyn Price, female   DOB: 1981-02-16, 42 y.o.   MRN: [id number]    Evelyn Price, is a 42 y.o. female  [id number]  [id number]  DOB - 08-Mar-1981  Chief Complaint  Patient presents with   Abdominal Pain   Knee Pain   Numbness    On head        Subjective:   Evelyn Price is a 42 y.o. female here today for pain in LUQ and midepigastric area.  Pain worse after eating.  No N/V.  Progressively stronger pain.  Occurring for 3-4 months.  No fever. No dysuria.  LMP-August 12th.  No pelvic pain.  Not Sexually Active.  Pain feels hot and burning.  No changes in BM.  No melena or BRBPR.  Appetite is good  Pain in the head-usually the crown of the head.  Tingling in the head.  Tylenol helps with pain but not the tingling.  This has been going on for a few weeks.  The tingling lasts about 5 mins. Eyes feel dry but no recent vision changes.  HA is mild to moderate and goes away with APAP.    No problems updated.  ALLERGIES: Allergies  Allergen Reactions   Other Other (See Comments)    Seasonal    PAST MEDICAL HISTORY: Past Medical History:  Diagnosis Date   Allergy    seasonal   Hyperthyroidism     MEDICATIONS AT HOME: Prior to Admission medications   Medication Sig Start Date End Date Taking? Authorizing Provider  azelastine (OPTIVAR) 0.05 % ophthalmic solution Place 1 drop into both eyes 2 (two) times daily. 02/19/23  Yes Georgian Co M, PA-C  omeprazole (PRILOSEC) 20 MG capsule Take 1 capsule (20 mg total) by mouth daily. 02/19/23  Yes Daryll Spisak M, PA-C  fluticasone (FLONASE) 50 MCG/ACT nasal spray Place 2 sprays into both nostrils daily. Patient not taking: Reported on 02/19/2023 11/04/22   Claiborne Rigg, NP  loratadine (CLARITIN) 10 MG tablet Take 1 tablet (10 mg total) by mouth daily. Patient not taking: Reported on 02/19/2023 11/04/22   Claiborne Rigg, NP    ROS: Neg resp Neg cardiac Neg GU Neg MS Neg  psych Neg neuro  Objective:   Vitals:   02/19/23 1515  BP: 133/89  Pulse: 79  SpO2: 99%  Weight: 131 lb 12.8 oz (59.8 kg)   Exam General appearance : Awake, alert, not in any distress. Speech Clear. Not toxic looking HEENT: Atraumatic and Normocephalic, pupils equally reactive to light and accomodation, EOM intact.  No redness of conjunctivae Neck: Supple, no JVD. No cervical lymphadenopathy.  Chest: Good air entry bilaterally, CTAB.  No rales/rhonchi/wheezing CVS: S1 S2 regular, no murmurs.  Abdomen: Bowel sounds present, Non tender and not distended with no gaurding, rigidity or rebound. Extremities: B/L Lower Ext shows no edema, both legs are warm to touch Neurology: Awake alert, and oriented X 3, CN II-XII intact, Non focal Skin: No Rash  Data Review Lab Results  Component Value Date   HGBA1C 6.1 (H) 09/23/2022   HGBA1C 5.6 09/20/2019   HGBA1C 5.3 04/21/2018    Assessment & Plan   1. Epigastric pain Definitely non acute abdomen - POCT urine pregnancy - POCT URINALYSIS DIP (CLINITEK) - Lipase - Comprehensive metabolic panel - omeprazole (PRILOSEC) 20 MG capsule; Take 1 capsule (20 mg total) by mouth daily.  Dispense: 30 capsule; Refill: 3 - H. pylori breath test  2. Dry eyes - azelastine (OPTIVAR) 0.05 %  ophthalmic solution; Place 1 drop into both eyes 2 (two) times daily.  Dispense: 6 mL; Refill: 12  3. LUQ pain - Lipase - Comprehensive metabolic panel - omeprazole (PRILOSEC) 20 MG capsule; Take 1 capsule (20 mg total) by mouth daily.  Dispense: 30 capsule; Refill: 3 - H. pylori breath test  4. Headache on top of head Relieved with tylenol  5. Paresthesia In the top of her head lasting 5 mins - CBC with Differential  6. History of hyperthyroidism - Thyroid Panel With TSH  7. Language barrier [employer] "[employer]" interpreters used and additional time performing visit was required.     Prn follow up  The patient was given clear instructions to go to ER or  return to medical center if symptoms don't improve, worsen or new problems develop. The patient verbalized understanding. The patient was told to call to get lab results if they haven't heard anything in the next week.      Georgian Co, PA-C [facility] [place], [place] [phone]   02/19/2023, 4:05 PM

## 2023-02-19 NOTE — Patient Instructions (Signed)
Dolor abdominal en los adultos Abdominal Pain, Adult  El dolor de [place] (abdominal) puede tener muchas causas. En la [employer], el dolor de [place] no es un problema grave y puede controlarse y Scientist, clinical (histocompatibility and immunogenetics). Pero en [employer], puede ser grave. El mdico intentar descubrir la causa del dolor de [place]. Siga estas instrucciones en su casa: Medicamentos [employer] de venta libre y los recetados solamente como se lo haya indicado el mdico. No tome medicamentos que lo ayuden a Advertising copywriter (laxantes), salvo que el mdico se lo indique. Instrucciones generales Est atento al dolor de estmago para Insurance risk surveyor cambio. Informe al mdico si el dolor empeora. Beber suficiente lquido para Radio producer pis (orina) de color amarillo plido. Comunquese con un mdico si: El dolor de [address] cambia o [place]. Tiene clicos muy intensos o mucha distensin en el vientre. Vomita. El dolor empeora con las comidas, despus de comer o con determinados alimentos. Tiene dificultades para defecar o produce heces lquidas durante ms de 2 o 3 das. No tiene apetito o baja de peso sin proponrselo. Presenta signos de no tener suficientes lquido o agua en el cuerpo (deshidratacin). Pueden incluir: [place], muy escasa o falta de [place]. Labios agrietados o Building surveyor. Somnolencia o debilidad. Siente dolor al orinar o defecar. El dolor de estmago lo despierta de noche. Observa sangre en la orina. Tiene fiebre. Solicite ayuda de inmediato si: No puede dejar de vomitar. Siente Chief Technology Officer en una sola parte del vientre, por ejemplo, en el lado derecho. Tiene heces con sangre, de color negro o con aspecto alquitranado. Tiene dificultad para respirar. Tiene dolor en el pecho. Estos sntomas pueden Customer service manager. Solicite ayuda de inmediato. Llame al 911. No espere a ver si los sntomas desaparecen. No conduzca por sus propios medios [employer]. Esta  informacin no tiene Theme park manager el consejo del mdico. Asegrese de hacerle al mdico cualquier pregunta que tenga. Document Revised: 06/29/2022 Document Reviewed: 06/29/2022 Elsevier Patient Education  2024 [employer].

## 2023-02-20 LAB — COMPREHENSIVE METABOLIC PANEL
ALT: 15 IU/L (ref 0–32)
AST: 19 IU/L (ref 0–40)
Albumin: 4.4 g/dL (ref 3.9–4.9)
Alkaline Phosphatase: 97 IU/L (ref 44–121)
BUN/Creatinine Ratio: 20 (ref 9–23)
BUN: 19 mg/dL (ref 6–24)
Bilirubin Total: 0.5 mg/dL (ref 0.0–1.2)
CO2: 23 mmol/L (ref 20–29)
Calcium: 9.3 mg/dL (ref 8.7–10.2)
Chloride: 101 mmol/L (ref 96–106)
Creatinine, Ser: 0.93 mg/dL (ref 0.57–1.00)
Globulin, Total: 2.4 g/dL (ref 1.5–4.5)
Glucose: 101 mg/dL — ABNORMAL HIGH (ref 70–99)
Potassium: 3.9 mmol/L (ref 3.5–5.2)
Sodium: 138 mmol/L (ref 134–144)
Total Protein: 6.8 g/dL (ref 6.0–8.5)
eGFR: 79 mL/min/{1.73_m2} (ref >59)

## 2023-02-20 LAB — LIPASE: Lipase: 42 U/L (ref 14–72)

## 2023-02-20 LAB — THYROID PANEL WITH TSH
Free Thyroxine Index: 2.1 (ref 1.2–4.9)
T3 Uptake Ratio: 25 % (ref 24–39)
T4, Total: 8.5 ug/dL (ref 4.5–12.0)
TSH: 1.13 u[IU]/mL (ref 0.450–4.500)

## 2023-02-20 LAB — CBC WITH DIFFERENTIAL/PLATELET
Basophils Absolute: 0 10*3/uL (ref 0.0–0.2)
Basos: 0 %
EOS (ABSOLUTE): 0.1 10*3/uL (ref 0.0–0.4)
Eos: 2 %
Hematocrit: 42.1 % (ref 34.0–46.6)
Hemoglobin: 13.7 g/dL (ref 11.1–15.9)
Immature Grans (Abs): 0 10*3/uL (ref 0.0–0.1)
Immature Granulocytes: 0 %
Lymphocytes Absolute: 2.8 10*3/uL (ref 0.7–3.1)
Lymphs: 31 %
MCH: 30.2 pg (ref 26.6–33.0)
MCHC: 32.5 g/dL (ref 31.5–35.7)
MCV: 93 fL (ref 79–97)
Monocytes Absolute: 0.5 10*3/uL (ref 0.1–0.9)
Monocytes: 6 %
Neutrophils Absolute: 5.4 10*3/uL (ref 1.4–7.0)
Neutrophils: 61 %
Platelets: 237 10*3/uL (ref 150–450)
RBC: 4.53 x10E6/uL (ref 3.77–5.28)
RDW: 13.3 % (ref 11.7–15.4)
WBC: 8.9 10*3/uL (ref 3.4–10.8)

## 2023-02-20 LAB — H. PYLORI BREATH TEST: H pylori Breath Test: NEGATIVE

## 2023-02-21 ENCOUNTER — Telehealth: Payer: Self-pay

## 2023-02-21 NOTE — Telephone Encounter (Signed)
-----   Message from Georgian Co sent at 02/20/2023  6:37 PM EDT ----- Stomach ulcer test is negative. Thanks, Georgian Co, PA-C

## 2023-02-21 NOTE — Telephone Encounter (Signed)
Pt was called and is aware of results, DOB was confirmed.  Interpreter id # [phone]

## 2023-02-21 NOTE — Telephone Encounter (Signed)
Pt was called and is aware of results, DOB was confirmed.  Interpreter id # [phone]

## 2023-02-21 NOTE — Telephone Encounter (Signed)
-----   Message from Georgian Co sent at 02/20/2023 12:47 PM EDT ----- Please call patient.  Your stomach ulcer test is not back yet.  Testing so far reveals normal thyroid, normal kidney, liver, and electrolyte levels,  and normal blood count.  I will let you know when stomach ulcer test results are in.  Thanks, Georgian Co, PA-C
